# Patient Record
Sex: Female | Born: 2000
Health system: Southern US, Community
[De-identification: ages and names within clinical notes are randomized; demographics above are authoritative.]

## PROBLEM LIST (undated history)

## (undated) DIAGNOSIS — N390 Urinary tract infection, site not specified: Secondary | ICD-10-CM

## (undated) HISTORY — DX: Urinary tract infection, site not specified: N39.0

## (undated) HISTORY — PX: NO PAST SURGERIES: SHX2092

---
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
## 2018-04-03 ENCOUNTER — Emergency Department (HOSPITAL_BASED_OUTPATIENT_CLINIC_OR_DEPARTMENT_OTHER)
Admission: EM | Admit: 2018-04-03 | Discharge: 2018-04-03 | Disposition: A | Discharge status: Home / Self Care | Payer: BLUE CROSS/BLUE SHIELD | Attending: Emergency Medicine | Admitting: Emergency Medicine

## 2018-04-03 ENCOUNTER — Encounter (HOSPITAL_BASED_OUTPATIENT_CLINIC_OR_DEPARTMENT_OTHER): Payer: Self-pay | Admitting: Emergency Medicine

## 2018-04-03 ENCOUNTER — Emergency Department (HOSPITAL_BASED_OUTPATIENT_CLINIC_OR_DEPARTMENT_OTHER): Payer: BLUE CROSS/BLUE SHIELD

## 2018-04-03 ENCOUNTER — Other Ambulatory Visit: Payer: Self-pay

## 2018-04-03 DIAGNOSIS — M79604 Pain in right leg: Secondary | ICD-10-CM | POA: Diagnosis not present

## 2018-04-03 DIAGNOSIS — S99911A Unspecified injury of right ankle, initial encounter: Secondary | ICD-10-CM | POA: Diagnosis not present

## 2018-04-03 DIAGNOSIS — S8991XA Unspecified injury of right lower leg, initial encounter: Secondary | ICD-10-CM | POA: Diagnosis not present

## 2018-04-03 DIAGNOSIS — M79661 Pain in right lower leg: Secondary | ICD-10-CM | POA: Diagnosis not present

## 2018-04-03 DIAGNOSIS — S060X0A Concussion without loss of consciousness, initial encounter: Secondary | ICD-10-CM | POA: Insufficient documentation

## 2018-04-03 DIAGNOSIS — Y9389 Activity, other specified: Secondary | ICD-10-CM | POA: Insufficient documentation

## 2018-04-03 DIAGNOSIS — Y92410 Unspecified street and highway as the place of occurrence of the external cause: Secondary | ICD-10-CM | POA: Insufficient documentation

## 2018-04-03 DIAGNOSIS — S0990XA Unspecified injury of head, initial encounter: Secondary | ICD-10-CM | POA: Diagnosis present

## 2018-04-03 DIAGNOSIS — Y999 Unspecified external cause status: Secondary | ICD-10-CM | POA: Diagnosis not present

## 2018-04-03 DIAGNOSIS — R52 Pain, unspecified: Secondary | ICD-10-CM | POA: Diagnosis not present

## 2018-04-03 DIAGNOSIS — R609 Edema, unspecified: Secondary | ICD-10-CM | POA: Diagnosis not present

## 2018-04-03 DIAGNOSIS — S79911A Unspecified injury of right hip, initial encounter: Secondary | ICD-10-CM | POA: Diagnosis not present

## 2018-04-03 LAB — PREGNANCY, URINE: Preg Test, Ur: NEGATIVE

## 2018-04-03 MED ORDER — OXYCODONE-ACETAMINOPHEN 5-325 MG PO TABS: 1.0000 | ORAL_TABLET | Freq: Three times a day (TID) | ORAL | 0 refills | Status: DC | PRN

## 2018-04-03 MED ORDER — METHOCARBAMOL 500 MG PO TABS: 500.0000 mg | ORAL_TABLET | Freq: Two times a day (BID) | ORAL | 0 refills | Status: DC

## 2018-04-03 MED ORDER — OXYCODONE-ACETAMINOPHEN 5-325 MG PO TABS
1.0000 | ORAL_TABLET | Freq: Once | ORAL | Status: AC
  Administered 2018-04-03: 1 via ORAL
  Filled 2018-04-03: qty 1

## 2018-04-03 MED ORDER — IBUPROFEN 400 MG PO TABS
600.0000 mg | ORAL_TABLET | Freq: Once | ORAL | Status: AC
  Administered 2018-04-03: 600 mg via ORAL
  Filled 2018-04-03: qty 1
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
## 2018-04-03 NOTE — ED Notes (Signed)
Pt verbalizes understanding of d/c instructions and denies any further needs at this time. 
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
## 2018-04-03 NOTE — ED Notes (Signed)
Patient transported to X-ray 

## 2018-04-03 NOTE — ED Notes (Signed)
Pt returned from xray
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
## 2018-04-07 ENCOUNTER — Telehealth: Payer: Self-pay
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
## 2018-04-08 ENCOUNTER — Encounter: Payer: Self-pay | Admitting: Family Medicine

## 2018-04-08 ENCOUNTER — Ambulatory Visit: Payer: BLUE CROSS/BLUE SHIELD | Admitting: Family Medicine

## 2018-04-08 DIAGNOSIS — S060X0A Concussion without loss of consciousness, initial encounter: Secondary | ICD-10-CM

## 2018-04-08 DIAGNOSIS — S060XAA Concussion with loss of consciousness status unknown, initial encounter: Secondary | ICD-10-CM | POA: Insufficient documentation

## 2018-04-08 DIAGNOSIS — S060X9A Concussion with loss of consciousness of unspecified duration, initial encounter: Secondary | ICD-10-CM | POA: Insufficient documentation

## 2018-04-08 NOTE — Patient Instructions (Signed)
Good to see you  Fish oil 2 grams daily  Vitamin D 2000 IU daily  CoQ10 200mg  daily for headaches Stop the musce relaxer See me again on Monday

## 2018-04-08 NOTE — Assessment & Plan Note (Signed)
Mild concussion.  Discussed icing regimen and home exercises.  Discussed at this point though I do feel that patient is somewhat over work with full-time school as well as full-time work and with patient's mild decrease in cognitive abilities I would like patient to refrain from either of these until 6 days and patient will be further evaluated.  Discussed over-the-counter medications that can be beneficial, discussed different treatment options.  Discussed avoiding certain activities that can be highly metabolic for patient's brain.  Follow-up again 4 to 8 weeks

## 2018-04-13 NOTE — Progress Notes (Deleted)
Subjective:   @VITALSMCOMMENTS @  Chief Complaint: Amanda Sanders, DOB: 08/18/2000, is a 17 y.o. female who presents for No chief complaint on file.   Injury date : *** Visit #: ***  Previous imagine.   History of Present Illness:    Concussion Self-Reported Symptom Score Symptoms rated on a scale 1-6, in last 24 hours  Headache: ***    Nausea: ***  Vomiting: ***  Balance Difficulty: ***   Dizziness: ***  Fatigue: ***  Trouble Falling Asleep: ***   Sleep More Than Usual: ***  Sleep Less Than Usual: ***  Daytime Drowsiness: ***  Photophobia: ***  Phonophobia: ***  Feeling anxious: ***  Confused: ***  Irritability: ***  Sadness: ***  Nervousness: ***  Feeling More Emotional: ***  Numbness or Tingling: ***  Feeling Slowed Down: ***  Feeling Mentally Foggy: ***  Difficulty Concentrating: ***  Difficulty Remembering: ***  Visual Problems: ***  Neck Pain: ***  Tinnitus: ***   Total Symptom Score: *** Previous Symptom Score: ***  Review of Systems: Pertinent items are noted in HPI.  Review of History: Past Medical History: @PMHP @  Past Surgical History:  has no past surgical history on file. Family History: family history is not on file. no family history of autoimmune Social History:  reports that she has never smoked. She has never used smokeless tobacco. She reports that she does not drink alcohol or use drugs. Current Medications: has a current medication list which includes the following prescription(s): lidocaine, methocarbamol, and oxycodone-acetaminophen. Allergies: has No Known Allergies.  Objective:    Physical Examination There were no vitals filed for this visit. General: No apparent distress alert and oriented x3 mood and affect normal, dressed appropriately.  HEENT: Pupils equal, extraocular movements intact  Respiratory: Patient's speak in full sentences and does not appear short of breath  Cardiovascular: No lower extremity edema, non  tender, no erythema  Skin: Warm dry intact with no signs of infection or rash on extremities or on axial skeleton.  Abdomen: Soft nontender  Neuro: Cranial nerves II through XII are intact, neurovascularly intact in all extremities with 2+ DTRs and 2+ pulses.  Lymph: No lymphadenopathy of posterior or anterior cervical chain or axillae bilaterally.  Gait normal with good balance and coordination.  MSK:  Non tender with full range of motion and good stability and symmetric strength and tone of shoulders, elbows, wrist,  knee and ankles bilaterally.  Psychiatric: Oriented X3, intact recent and remote memory, judgement and insight, normal mood and affect  Concussion testing performed today:  I spent *** minutes with patient discussing test and results including review of history and patient chart and  integration of patient data, interpretation of standardized test results and clinical data, clinical decision making, treatment planning and report,and interactive feedback to the patient with all of patients questions answered.    Neurocognitive testing (ImPACT):  Baseline:*** Post #1: ***   Verbal Memory Composite *** (***%) *** (***%)   Visual Memory Composite *** (***%) *** (***%)   Visual Motor Speed Composite *** (***%) *** (***%)   Reaction Time Composite *** (***%) *** (***%)   Cognitive Efficiency Index *** ***     Vestibular Screening:   Pre VOMS  HA Score: *** Pre VOMS  Dizziness Score: ***   Headache  Dizziness  Smooth Pursuits *** ***  H. Saccades *** ***  V. Saccades *** ***  H. VOR *** ***  V. VOR *** ***  Visual Motor Sensitivity *** ***  Accommodation Right: ***  cm Left: *** cm *** ***  Convergence: *** cm Divergence: *** cm *** ***   Balance Screen: ***  Additional testing performed today: { :28529}   Assessment:    No diagnosis found.  Amanda Sanders presents with the following concussion  subtypes. [] Cognitive [] Cervical [] Vestibular [] Ocular [] Migraine [] Anxiety/Mood   Plan:   Action/Discussion: Reviewed diagnosis, management options, expected outcomes, and the reasons for scheduled and emergent follow-up. Questions were adequately answered. Patient expressed verbal understanding and agreement with the following plan.      Participation in school/work: Patient is cleared to return to work/school and activities of daily living without restrictions.  Patient is not cleared to return to work/school until further notice.  Patient may return to work/school on ***, with the following restrictions/supports:  Extra Time:  Take mental rest breaks during the day as needed. Check for return of symptoms when participating in any activities that require a significant amount of attention or concentration.  Allow extra time to complete tasks.  Please allow *** weeks to make up missed assignments, test, quizzes.  Visual/Vestibular Accommodations in School:  Allow patient to eat lunch in quiet environment with 1-2 classmates.  Allow patient to leave class 5 minutes before end of period to avoid busy/noisy hallway.  Please provide any supplemental learning materials (power points, lecture notes, handouts, etc) in minimum size 18 font and allow/provide any auditory supplements to learning when possible (books on tape, audio tape lectures, etc) to limit visual stress in the classroom.  Patient is cleared for auditory participation only. Patient is not cleared for homework, quizzes, or tests at this time.   Testing:  May begin taking tests/quizzes on *** with no more than one test/quiz per day.   No significant classroom or standardized testing until ***.  Home/Extracurricular:  Lessen work/homework load to allow adequate cognitive rest. Work *** minutes with intervals of *** minute breaks (total *** hours).  Limit visual stimulants including: driving, watching  television/movies, reading, using cell phone, etc. - to ensure relative visual cognitive rest. NOT cleared for video or phone games. May participate *** minutes with intervals of *** minute breaks (total *** hours).    Active Treatment Strategies:  Fueling your brain is important for recovery. It is essential to stay well hydrated, aiming for half of your body weight in fluid ounces per day (100 lbs = 50 oz). We also recommend eating breakfast to start your day and focus on a well-balanced diet containing lean protein, 'good' fats, and complex carbohydrates. See your nutrition / hydration handout for more details.   Quality sleep is vital in your concussion recovery. We encourage lots of sleep for the first 24-72 hours after injury but following this period it is important to regulate your sleep cycle. We encourage 8 hours of quality sleep per night. See your sleep handout for more details and strategies to quality sleep.  I  Treating your vestibular and visual dysfunction will decrease your recovery time and improve your symptoms. Begin your home vestibular exercise program as directed on your AVS.    Begin taking DHA supplement as directed.  .   Follow-up information:  Follow up appointment at [facility] .   Patient needs to arrive 30 minutes prior to appointment to complete the following tests: { :28378}.    Patient Education:  Reviewed with patient the risks (i.e, a repeat concussion, post-concussion syndrome, second-impact syndrome) of returning to play prior to complete resolution, and thoroughly reviewed the signs and symptoms of  concussion.Reviewed need for complete resolution of all symptoms, with rest AND exertion, prior to return to play.  Reviewed red flags for urgent medical evaluation: worsening symptoms, nausea/vomiting, intractable headache, musculoskeletal changes, focal neurological deficits.  [facility]'s Concussion Care Plan, which clearly  outlines the plans stated above, was given to patient.  I was personally involved with the physical evaluation of and am in agreement with the assessment and treatment plan for this patient.  Greater than 50% of this encounter was spent in direct consultation with the patient in evaluation, counseling, and coordination of care. Duration of encounter: { :28531} minutes.  After Visit Summary printed out and provided to patient as appropriate.

## 2018-04-14 ENCOUNTER — Ambulatory Visit: Payer: BLUE CROSS/BLUE SHIELD | Admitting: Family Medicine

## 2018-04-14 DIAGNOSIS — Z0289 Encounter for other administrative examinations: Secondary | ICD-10-CM

## 2018-07-10 DIAGNOSIS — Z3041 Encounter for surveillance of contraceptive pills: Secondary | ICD-10-CM | POA: Diagnosis not present

## 2018-08-11 DIAGNOSIS — N899 Noninflammatory disorder of vagina, unspecified: Secondary | ICD-10-CM | POA: Diagnosis not present

## 2018-10-27 ENCOUNTER — Other Ambulatory Visit: Payer: Self-pay

## 2018-10-27 ENCOUNTER — Inpatient Hospital Stay (HOSPITAL_COMMUNITY)
Admission: AD | Admit: 2018-10-27 | Discharge: 2018-10-27 | Disposition: A | Discharge status: Home / Self Care | Payer: BC Managed Care – PPO | Attending: Obstetrics and Gynecology | Admitting: Obstetrics and Gynecology

## 2018-10-27 DIAGNOSIS — N912 Amenorrhea, unspecified: Secondary | ICD-10-CM | POA: Insufficient documentation

## 2018-10-27 NOTE — MAU Provider Note (Signed)
Amanda Sanders is a 18 y.o. No obstetric history on file. at Unknown who presents to MAU today for pregnancy verification. The patient denies abdominal pain or vaginal bleeding today.   BP 119/61 (BP Location: Right Arm)   Pulse 77   Temp 98.4 F (36.9 C) (Oral)   Resp 16   Wt 48.5 kg   LMP 09/21/2018   SpO2 100%   BMI 20.20 kg/m   CONSTITUTIONAL: Well-developed, well-nourished female in no acute distress.  MUSCULOSKELETAL: Normal range of motion.  CARDIOVASCULAR: Regular heart rate RESPIRATORY: Normal effort NEUROLOGICAL: Alert and oriented to person, place, and time.  SKIN: No pallor. PSYCH: Normal mood and affect. Normal behavior. Normal judgment and thought content.  No results found for this or any previous visit (from the past 24 hour(s)).  MDM Patient advised that without concerning symptoms today UPT will not be performed in MAU at this time  A: Amenorrhea  P: Discharge home Pt advised that routine pregnancy tests are offered in the [facility] Monday- Friday 8:15-4:00 Patient may return to MAU as needed or if her condition were to change or worsen   Donette Larry, CNM  10/27/2018 11:23 AM

## 2018-10-27 NOTE — MAU Note (Signed)
Just here to confirm pregnancy.  Took 3 tests at home, they were all positive.  No pain or bleeding.

## 2018-10-29 ENCOUNTER — Encounter: Payer: Self-pay | Admitting: Family Medicine

## 2018-10-29 ENCOUNTER — Other Ambulatory Visit: Payer: Self-pay

## 2018-10-29 ENCOUNTER — Ambulatory Visit (INDEPENDENT_AMBULATORY_CARE_PROVIDER_SITE_OTHER): Payer: BC Managed Care – PPO

## 2018-10-29 DIAGNOSIS — Z3201 Encounter for pregnancy test, result positive: Secondary | ICD-10-CM

## 2018-10-29 LAB — POCT PREGNANCY, URINE: Preg Test, Ur: POSITIVE — AB

## 2018-10-29 NOTE — Progress Notes (Signed)
Pt here today for pregnancy test.  Resulted positive.  Pt reports LMP 09/21/18 EDD 06/28/19 5w 3d today.  Medications reconciled.  List of medications safe to take in pregnancy give to pt.  Proof of pregnancy letter provided by front office to start prenatal care.   Addison Naegeli, RN 10/29/18

## 2018-10-29 NOTE — Progress Notes (Signed)
I have reviewed this chart and agree with the RN/CMA assessment and management.    K. Meryl Alistar Mcenery, M.D. Attending [facility] (Faculty Practice)   

## 2018-12-03 ENCOUNTER — Other Ambulatory Visit: Payer: Self-pay | Admitting: Critical Care Medicine

## 2018-12-03 DIAGNOSIS — Z20822 Contact with and (suspected) exposure to covid-19: Secondary | ICD-10-CM

## 2018-12-08 LAB — NOVEL CORONAVIRUS, NAA: SARS-CoV-2, NAA: NOT DETECTED

## 2018-12-18 ENCOUNTER — Other Ambulatory Visit (HOSPITAL_COMMUNITY): Payer: Self-pay | Admitting: Family

## 2018-12-18 DIAGNOSIS — Z3682 Encounter for antenatal screening for nuchal translucency: Secondary | ICD-10-CM

## 2018-12-18 DIAGNOSIS — Z3A13 13 weeks gestation of pregnancy: Secondary | ICD-10-CM

## 2018-12-22 ENCOUNTER — Encounter (HOSPITAL_COMMUNITY): Payer: Self-pay | Admitting: *Deleted

## 2018-12-25 ENCOUNTER — Other Ambulatory Visit: Payer: Self-pay

## 2018-12-25 ENCOUNTER — Ambulatory Visit (HOSPITAL_COMMUNITY): Payer: BC Managed Care – PPO | Admitting: *Deleted

## 2018-12-25 ENCOUNTER — Ambulatory Visit (HOSPITAL_COMMUNITY)
Admission: RE | Admit: 2018-12-25 | Discharge: 2018-12-25 | Disposition: A | Discharge status: Home / Self Care | Payer: BC Managed Care – PPO | Source: Ambulatory Visit | Attending: Obstetrics and Gynecology | Admitting: Obstetrics and Gynecology

## 2018-12-25 ENCOUNTER — Encounter (HOSPITAL_COMMUNITY): Payer: Self-pay

## 2018-12-25 ENCOUNTER — Ambulatory Visit (HOSPITAL_COMMUNITY): Payer: BC Managed Care – PPO

## 2018-12-25 VITALS — BP 114/66 | HR 70 | Temp 97.8°F | Wt 110.0 lb

## 2018-12-25 DIAGNOSIS — Z3682 Encounter for antenatal screening for nuchal translucency: Secondary | ICD-10-CM | POA: Insufficient documentation

## 2018-12-25 DIAGNOSIS — Z3A13 13 weeks gestation of pregnancy: Secondary | ICD-10-CM | POA: Diagnosis not present

## 2018-12-25 DIAGNOSIS — Z369 Encounter for antenatal screening, unspecified: Secondary | ICD-10-CM | POA: Insufficient documentation

## 2018-12-27 LAB — FIRST TRIMESTER SCREEN W/NT
CRL: 73.7 mm
DIA MoM: 0.49
DIA Value: 126.3 pg/mL
Gest Age-Collect: 13.3 weeks
Maternal Age At EDD: 20.8 yr
Nuchal Translucency MoM: 0.8
Nuchal Translucency: 1.3 mm
Number of Fetuses: 1
PAPP-A MoM: 0.87
PAPP-A Value: 1706 ng/mL
Test Results:: NEGATIVE
Weight: 110 [lb_av]
hCG MoM: 0.84
hCG Value: 83.4 IU/mL

## 2018-12-29 ENCOUNTER — Telehealth (HOSPITAL_COMMUNITY): Payer: Self-pay | Admitting: Genetic Counselor

## 2018-12-29 LAB — OB RESULTS CONSOLE RPR: RPR: NONREACTIVE

## 2018-12-29 LAB — OB RESULTS CONSOLE ABO/RH: RH Type: POSITIVE

## 2018-12-29 LAB — OB RESULTS CONSOLE RUBELLA ANTIBODY, IGM: Rubella: IMMUNE

## 2018-12-29 LAB — OB RESULTS CONSOLE HEPATITIS B SURFACE ANTIGEN: Hepatitis B Surface Ag: NEGATIVE

## 2018-12-29 LAB — OB RESULTS CONSOLE HIV ANTIBODY (ROUTINE TESTING): HIV: NONREACTIVE

## 2018-12-29 NOTE — Telephone Encounter (Signed)
I called Ms. Bresnan to discuss her negative first trimester screen results. We reviewed that the risk for her pregnancy to be affected by Down syndrome decreased from her 1 in 1031 age-related risk to <1 in 10,000, and the risk for trisomy 18 decreased from her 1 in 2019 age-related risk to <1 in 10,000 based on the results of this screen. Ms. Clay was reminded that while this screen significantly reduces the likelihood of the pregnancy being affected by trisomy 67 or trisomy 40, it cannot be considered diagnostic. Diagnostic testing via [facility] or amniocentesis is available should she be interested in pursuing this. We also reviewed that first trimester screening does not screen for open neural tube defects such as spina bifida, so her doctor should order AFP screening around 16-18 weeks to screen for this. Ms. Asfour confirmed that she had no questions about these results.  Buelah Manis, MS Genetic Counselor

## 2019-02-22 ENCOUNTER — Encounter (HOSPITAL_COMMUNITY): Payer: Self-pay | Admitting: Emergency Medicine

## 2019-02-22 ENCOUNTER — Other Ambulatory Visit: Payer: Self-pay

## 2019-02-22 ENCOUNTER — Emergency Department (HOSPITAL_COMMUNITY)
Admission: EM | Admit: 2019-02-22 | Discharge: 2019-02-22 | Disposition: A | Discharge status: Home / Self Care | Payer: Medicaid Other | Attending: Emergency Medicine | Admitting: Emergency Medicine

## 2019-02-22 ENCOUNTER — Emergency Department (HOSPITAL_COMMUNITY): Payer: Medicaid Other

## 2019-02-22 DIAGNOSIS — O9989 Other specified diseases and conditions complicating pregnancy, childbirth and the puerperium: Secondary | ICD-10-CM | POA: Insufficient documentation

## 2019-02-22 DIAGNOSIS — R072 Precordial pain: Secondary | ICD-10-CM

## 2019-02-22 DIAGNOSIS — Z79899 Other long term (current) drug therapy: Secondary | ICD-10-CM | POA: Insufficient documentation

## 2019-02-22 DIAGNOSIS — Z3492 Encounter for supervision of normal pregnancy, unspecified, second trimester: Secondary | ICD-10-CM

## 2019-02-22 DIAGNOSIS — Z3A22 22 weeks gestation of pregnancy: Secondary | ICD-10-CM | POA: Diagnosis not present

## 2019-02-22 LAB — CBC
HCT: 36.8 % (ref 36.0–46.0)
Hemoglobin: 12 g/dL (ref 12.0–15.0)
MCH: 32.8 pg (ref 26.0–34.0)
MCHC: 32.6 g/dL (ref 30.0–36.0)
MCV: 100.5 fL — ABNORMAL HIGH (ref 80.0–100.0)
Platelets: 156 10*3/uL (ref 150–400)
RBC: 3.66 MIL/uL — ABNORMAL LOW (ref 3.87–5.11)
RDW: 12.9 % (ref 11.5–15.5)
WBC: 6.5 10*3/uL (ref 4.0–10.5)
nRBC: 0 % (ref 0.0–0.2)

## 2019-02-22 LAB — URINALYSIS, ROUTINE W REFLEX MICROSCOPIC
Bacteria, UA: NONE SEEN
Bilirubin Urine: NEGATIVE
Glucose, UA: NEGATIVE mg/dL
Hgb urine dipstick: NEGATIVE
Ketones, ur: NEGATIVE mg/dL
Nitrite: NEGATIVE
Protein, ur: NEGATIVE mg/dL
Specific Gravity, Urine: 1.015 (ref 1.005–1.030)
pH: 7 (ref 5.0–8.0)

## 2019-02-22 LAB — BASIC METABOLIC PANEL
Anion gap: 6 (ref 5–15)
BUN: 6 mg/dL (ref 6–20)
CO2: 23 mmol/L (ref 22–32)
Calcium: 8.6 mg/dL — ABNORMAL LOW (ref 8.9–10.3)
Chloride: 107 mmol/L (ref 98–111)
Creatinine, Ser: 0.42 mg/dL — ABNORMAL LOW (ref 0.44–1.00)
GFR calc Af Amer: >60 mL/min (ref >60)
GFR calc non Af Amer: >60 mL/min (ref >60)
Glucose, Bld: 69 mg/dL — ABNORMAL LOW (ref 70–99)
Potassium: 3.7 mmol/L (ref 3.5–5.1)
Sodium: 136 mmol/L (ref 135–145)

## 2019-02-22 LAB — HCG, QUANTITATIVE, PREGNANCY: hCG, Beta Chain, Quant, S: 23418 m[IU]/mL — ABNORMAL HIGH (ref <5)

## 2019-02-22 LAB — TROPONIN I (HIGH SENSITIVITY)
Troponin I (High Sensitivity): <2 ng/L (ref <18)
Troponin I (High Sensitivity): <2 ng/L (ref <18)

## 2019-02-22 NOTE — ED Provider Notes (Signed)
[facility]-EMERGENCY DEPT Provider Note   CSN: [id number] Arrival date & time: 02/22/19  0747     History   Chief Complaint Chief Complaint  Patient presents with  . chest pain    HPI Amanda Sanders is a 18 y.o. female.     HPI 18 year old G1, P0 female who is about 5 months pregnant comes in a chief complaint of chest pain. She reports that over the last 2 or 3 days she has had episodes of chest pain described as left-sided, sharp pain.  Pain has been intermittent with no specific evoking or aggravating factors.  When she does have the pain it is worse with deep inspiration or cough.  She has no associated shortness of breath, diaphoresis, nausea, dizziness.  Patient has no family history of CAD, clotting disorder.  She herself has not had any history of PE, DVT but was taking birth control for about a year prior to getting pregnant.  Patient denies any abdominal pain.  She does intermittently get some periumbilical pain at nighttime but that is not new and is self resolving.  She has no vaginal discharge, bleeding.  Past Medical History:  Diagnosis Date  . Medical history non-contributory   . UTI (urinary tract infection)     Patient Active Problem List   Diagnosis Date Noted  . Mild concussion 04/08/2018    Past Surgical History:  Procedure Laterality Date  . NO PAST SURGERIES       OB History    Gravida  1   Para      Term      Preterm      AB      Living  0     SAB      TAB      Ectopic      Multiple      Live Births               Home Medications    Prior to Admission medications   Medication Sig Start Date End Date Taking? Authorizing Provider  prenatal vitamin w/FE, FA (PRENATAL 1 + 1) 27-1 MG TABS tablet Take 1 tablet by mouth daily at 12 noon.    [provider]    Family History No family history on file.  Social History Social History   Tobacco Use  . Smoking status: Never Smoker  .  Smokeless tobacco: Never Used  Substance Use Topics  . Alcohol use: Never    Frequency: Never  . Drug use: Never     Allergies   Penicillins   Review of Systems Review of Systems  Constitutional: Positive for activity change.  Respiratory: Negative for shortness of breath.   Cardiovascular: Positive for chest pain.  Gastrointestinal: Negative for nausea and vomiting.  Neurological: Negative for dizziness.  All other systems reviewed and are negative.    Physical Exam Updated Vital Signs BP 105/68 (BP Location: Right Arm)   Pulse 74   Temp 99 F (37.2 C) (Oral)   Resp 18   LMP 09/21/2018 (Exact Date)   SpO2 100%   Physical Exam Vitals signs and nursing note reviewed.  Constitutional:      Appearance: She is well-developed.  HENT:     Head: Normocephalic and atraumatic.  Neck:     Musculoskeletal: Normal range of motion and neck supple.  Cardiovascular:     Rate and Rhythm: Normal rate.  Pulmonary:     Effort: Pulmonary effort is normal.  Abdominal:  General: Bowel sounds are normal.     Tenderness: There is no abdominal tenderness.  Skin:    General: Skin is warm and dry.  Neurological:     Mental Status: She is alert and oriented to person, place, and time.      ED Treatments / Results  Labs (all labs ordered are listed, but only abnormal results are displayed) Labs Reviewed  BASIC METABOLIC PANEL - Abnormal; Notable for the following components:      Result Value   Glucose, Bld 69 (*)    Creatinine, Ser 0.42 (*)    Calcium 8.6 (*)    All other components within normal limits  CBC - Abnormal; Notable for the following components:   RBC 3.66 (*)    MCV 100.5 (*)    All other components within normal limits  HCG, QUANTITATIVE, PREGNANCY - Abnormal; Notable for the following components:   hCG, Beta Chain, Quant, S 23,418 (*)    All other components within normal limits  URINALYSIS, ROUTINE W REFLEX MICROSCOPIC - Abnormal; Notable for the  following components:   APPearance HAZY (*)    Leukocytes,Ua MODERATE (*)    All other components within normal limits  TROPONIN I (HIGH SENSITIVITY)  TROPONIN I (HIGH SENSITIVITY)    EKG EKG Interpretation  Date/Time:  "Sunday February 22 2019 07:58:54 EDT Ventricular Rate:  80 PR Interval:    QRS Duration: 84 QT Interval:  355 QTC Calculation: 410 R Axis:   37 Text Interpretation:  Sinus rhythm RSR' in V1 or V2, probably normal variant Borderline T abnormalities, anterior leads No old tracing to compare Confirmed by Belfi, Melanie ([id number]) on 02/22/2019 8:01:32 AM   Radiology Dg Chest 2 View  Result Date: 02/22/2019 CLINICAL DATA:  Chest pain. EXAM: CHEST - 2 VIEW COMPARISON:  None. FINDINGS: The heart size and mediastinal contours are within normal limits. Both lungs are clear. No pneumothorax or pleural effusion is noted. The visualized skeletal structures are unremarkable. IMPRESSION: No active cardiopulmonary disease. Electronically Signed   By: James  Green Jr M.D.   On: 02/22/2019 09:35    Procedures Procedures (including critical care time)  Medications Ordered in ED Medications - No data to display   Initial Impression / Assessment and Plan / ED Course  I have reviewed the triage vital signs and the nursing notes.  Pertinent labs & imaging results that were available during my care of the patient were reviewed by me and considered in my medical decision making (see chart for details).        18"  year old comes in a chief complaint of chest pain.  She is G1, P0 about 5 months pregnant.  She was on birth control prior to getting pregnant.  She has no known history of PE, DVT.  On exam she is not tachycardic, tachypneic, hypoxic.  She denies any shortness of breath and the pain is not reproducible with deep inspiration or with coughing in the ED.  She does indicate that when she is hurting, the pain is worse with deep inspiration.  Suspicion for PE is present but it is  low.  We had a shared decision-making discussion with the patient.  She was made aware that she is at high risk of having PE because of her pregnancy and prior birth control use, however our pretest probability for the PE is low.  We discussed the conservative versus aggressive approach of ruling out PE in the ED and she prefers a conservative approach of waiting and  watching.  She will return to the ER if her symptoms get worse.  We have discussed strict ER return precautions with her.  In the interim, her EKG is completely normal and her delta troponin is also negative.  She is stable for discharge.  Final Clinical Impressions(s) / ED Diagnoses   Final diagnoses:  Precordial chest pain  Pregnant and not yet delivered in second trimester    ED Discharge Orders         Ordered    LE VENOUS     02/22/19 Pickens, Kindsey Eblin, MD 02/22/19 Vernelle Emerald

## 2019-02-22 NOTE — Discharge Instructions (Signed)
We saw in the ER for chest pain. It is unclear why you are having this off and on chest pain episodes.  As discussed, our suspicion for blood clot is low but it is not entirely eliminated.  You have agreed to a conservative approach, therefore we request you to return to the ER if you start having worsening of the chest pain, worsening of shortness of breath, episodes of near fainting or fainting.  If you start having chest pain with inspiration or bloody cough, also return to the ER immediately.  Ultrasound of your legs have been ordered as an outpatient.

## 2019-02-22 NOTE — ED Triage Notes (Signed)
Pt c/o intermittent chest pains and upper abd pains. Pt is [redacted] weeks pregnant. Denies n/v/d or urinary problems.

## 2019-02-23 ENCOUNTER — Ambulatory Visit (HOSPITAL_COMMUNITY): Admission: RE | Admit: 2019-02-23 | Payer: Medicaid Other | Source: Ambulatory Visit

## 2019-03-23 ENCOUNTER — Inpatient Hospital Stay (HOSPITAL_COMMUNITY)
Admission: AD | Admit: 2019-03-23 | Discharge: 2019-03-23 | Disposition: A | Discharge status: Home / Self Care | Payer: Medicaid Other | Attending: Obstetrics and Gynecology | Admitting: Obstetrics and Gynecology

## 2019-03-23 ENCOUNTER — Other Ambulatory Visit: Payer: Self-pay

## 2019-03-23 ENCOUNTER — Encounter (HOSPITAL_COMMUNITY): Payer: Self-pay

## 2019-03-23 DIAGNOSIS — Z880 Allergy status to penicillin: Secondary | ICD-10-CM | POA: Diagnosis not present

## 2019-03-23 DIAGNOSIS — W19XXXA Unspecified fall, initial encounter: Secondary | ICD-10-CM | POA: Diagnosis not present

## 2019-03-23 DIAGNOSIS — Z3A26 26 weeks gestation of pregnancy: Secondary | ICD-10-CM

## 2019-03-23 DIAGNOSIS — O26892 Other specified pregnancy related conditions, second trimester: Secondary | ICD-10-CM | POA: Diagnosis not present

## 2019-03-23 DIAGNOSIS — R109 Unspecified abdominal pain: Secondary | ICD-10-CM | POA: Diagnosis not present

## 2019-03-23 DIAGNOSIS — R55 Syncope and collapse: Secondary | ICD-10-CM | POA: Insufficient documentation

## 2019-03-23 DIAGNOSIS — O9A212 Injury, poisoning and certain other consequences of external causes complicating pregnancy, second trimester: Secondary | ICD-10-CM | POA: Insufficient documentation

## 2019-03-23 LAB — COMPREHENSIVE METABOLIC PANEL
ALT: 27 U/L (ref 0–44)
AST: 26 U/L (ref 15–41)
Albumin: 3 g/dL — ABNORMAL LOW (ref 3.5–5.0)
Alkaline Phosphatase: 42 U/L (ref 38–126)
Anion gap: 12 (ref 5–15)
BUN: 5 mg/dL — ABNORMAL LOW (ref 6–20)
CO2: 19 mmol/L — ABNORMAL LOW (ref 22–32)
Calcium: 8.5 mg/dL — ABNORMAL LOW (ref 8.9–10.3)
Chloride: 106 mmol/L (ref 98–111)
Creatinine, Ser: 0.5 mg/dL (ref 0.44–1.00)
GFR calc Af Amer: >60 mL/min (ref >60)
GFR calc non Af Amer: >60 mL/min (ref >60)
Glucose, Bld: 72 mg/dL (ref 70–99)
Potassium: 3.4 mmol/L — ABNORMAL LOW (ref 3.5–5.1)
Sodium: 137 mmol/L (ref 135–145)
Total Bilirubin: 0.3 mg/dL (ref 0.3–1.2)
Total Protein: 6.2 g/dL — ABNORMAL LOW (ref 6.5–8.1)

## 2019-03-23 LAB — CBC
HCT: 35.3 % — ABNORMAL LOW (ref 36.0–46.0)
Hemoglobin: 12.3 g/dL (ref 12.0–15.0)
MCH: 33.2 pg (ref 26.0–34.0)
MCHC: 34.8 g/dL (ref 30.0–36.0)
MCV: 95.1 fL (ref 80.0–100.0)
Platelets: 142 10*3/uL — ABNORMAL LOW (ref 150–400)
RBC: 3.71 MIL/uL — ABNORMAL LOW (ref 3.87–5.11)
RDW: 12.3 % (ref 11.5–15.5)
WBC: 6.5 10*3/uL (ref 4.0–10.5)
nRBC: 0 % (ref 0.0–0.2)

## 2019-03-23 LAB — URINALYSIS, ROUTINE W REFLEX MICROSCOPIC
Bilirubin Urine: NEGATIVE
Glucose, UA: NEGATIVE mg/dL
Hgb urine dipstick: NEGATIVE
Ketones, ur: 15 mg/dL — AB
Nitrite: NEGATIVE
Protein, ur: NEGATIVE mg/dL
Specific Gravity, Urine: 1.01 (ref 1.005–1.030)
pH: 7.5 (ref 5.0–8.0)

## 2019-03-23 LAB — GLUCOSE, CAPILLARY: Glucose-Capillary: 63 mg/dL — ABNORMAL LOW (ref 70–99)

## 2019-03-23 LAB — URINALYSIS, MICROSCOPIC (REFLEX)

## 2019-03-23 NOTE — MAU Note (Signed)
Pt is G1P0 who arrived via EMS. She says she got up from bed around 1100 this morning to brush her teeth. She says she started feeling weak but doesn't remember anything after that. Her boyfriend found her on the floor and had to shake her to wake her up. He said she was on her stomach at the time. Patient denies any bleeding or LOF. +FM. Rating lower abdominal pain 5/10.  Patient has no hx of syncope. She last ate 6pm last night. Had some water during the night.

## 2019-03-23 NOTE — Discharge Instructions (Signed)
Preventing Injuries During Pregnancy °Trauma is the most common cause of injury and death in pregnant women. This can also result in serious harm to the baby or even death. °How can injuries affect my pregnancy? °Your baby is protected in the womb (uterus) by a sac filled with fluid (amniotic sac). Your baby can be harmed if there is a direct blow to your abdomen and pelvis. Trauma may be caused by: °· Falls. These are more common in the second and third trimester of pregnancy. °· Automobile accidents. °· Domestic violence or assault. °· Severe burns, such as from fire or electricity. °These injuries can result in: °· Tearing of your uterus. °· The placenta pulling away from the wall of the uterus (placental abruption). °· The amniotic sac breaking open (rupture of membranes). °· Blockage or decrease in the blood supply to your baby. °· Going into labor earlier than expected. °· Severe injuries to other parts of your body, such as your brain, spine, heart, lungs, or other organs. °Minor falls and low-impact automobile accidents do not usually harm your baby, even if they cause a little harm to you. °What can I do to lower my risk? °Safety °· Remove slippery rugs and loose objects on the floor. They increase your risk of tripping or slipping. °· Wear comfortable shoes that have a good grip on the sole. Do not wear high-heeled shoes. °· Always wear your seat belt properly when riding in a car. Use both the lap and shoulder belt, with the lap belt below your abdomen. Always practice safe driving. Do not ride on a motorcycle while pregnant. °Activity °· Avoid walking on wet or slippery floors. °· Do not participate in rough and violent activities or sports. °· Avoid high-risk situations and activities such as: °? Lifting heavy pots of boiling or hot liquids. °? Fixing electrical problems. °? Being near fires or starting fires. °General instructions °· Take over-the-counter and prescription medicines only as told by your  health care provider. °· Know your blood type and the father's blood type in case you develop vaginal bleeding or experience an injury for which a blood transfusion is needed. °· Spousal abuse can be a serious cause of trauma during pregnancy. If you are a victim of domestic violence or assault: °? Call your local emergency services (911 in the [place] °? Contact the National Domestic Violence Hotline for help and support. °When should I seek immediate medical care? °Get help right away if: °· You fall on your abdomen or experience any serious blow to your abdomen. °· You develop stiffness in your neck or pain after a fall or from other trauma. °· You develop a headache or vision problems after a fall or from other trauma. °· You do not feel the baby moving after a fall or trauma, or you feel that the baby is not moving as much as before the fall or trauma. °· You have been the victim of domestic violence or any other kind of physical attack. °· You have been in a car accident. °· You develop vaginal bleeding. °· You have fluid leaking from the vagina. °· You develop uterine contractions. Symptoms include pelvic cramping, pain, or serious low back pain. °· You become weak, faint, or have uncontrolled vomiting after trauma. °· You have a serious burn. This includes burns to the face, neck, hands, or genitals, or burns greater than the size of your palm anywhere else. °Summary °· Trauma is the most common cause of injury and death   in pregnant women and can also lead to injury or death of the baby.  Falls, automobile accidents, domestic violence or assault, and severe burns can injure you or your baby. Make sure to get medical help right away if you experience any of these during your pregnancy.  Take steps to prevent slips or falls in your home, such as avoiding slippery floors and removing loose rugs.  Always wear your seat belt properly when riding in a car. Practice safe driving. This information is not  intended to replace advice given to you by your health care provider. Make sure you discuss any questions you have with your health care provider. Document Released: 06/21/2004 Document Revised: 09/11/2018 Document Reviewed: 05/23/2016 Elsevier Patient Education  2020 [employer].      Eating Plan for Pregnant Women While you are pregnant, your body requires additional nutrition to help support your growing baby. You also have a higher need for some vitamins and minerals, such as folic acid, calcium, iron, and vitamin D. Eating a healthy, well-balanced diet is very important for your health and your baby's health. Your need for extra calories varies for the three 41-month segments of your pregnancy (trimesters). For most women, it is recommended to consume:  150 extra calories a day during the first trimester.  300 extra calories a day during the second trimester.  300 extra calories a day during the third trimester. What are tips for following this plan?   Do not try to lose weight or go on a diet during pregnancy.  Limit your overall intake of foods that have "empty calories." These are foods that have little nutritional value, such as sweets, desserts, candies, and sugar-sweetened beverages.  Eat a variety of foods (especially fruits and vegetables) to get a full range of vitamins and minerals.  Take a prenatal vitamin to help meet your additional vitamin and mineral needs during pregnancy, specifically for folic acid, iron, calcium, and vitamin D.  Remember to stay active. Ask your health care provider what types of exercise and activities are safe for you.  Practice good food safety and cleanliness. Wash your hands before you eat and after you prepare raw meat. Wash all fruits and vegetables well before peeling or eating. Taking these actions can help to prevent food-borne illnesses that can be very dangerous to your baby, such as listeriosis. Ask your health care provider for more  information about listeriosis. What does 150 extra calories look like? Healthy options that provide 150 extra calories each day could be any of the following:  6-8 oz (170-230 g) of plain low-fat yogurt with  cup of berries.  1 apple with 2 teaspoons (11 g) of peanut butter.  Cut-up vegetables with  cup (60 g) of hummus.  8 oz (230 mL) or 1 cup of low-fat chocolate milk.  1 stick of string cheese with 1 medium orange.  1 peanut butter and jelly sandwich that is made with one slice of whole-wheat bread and 1 tsp (5 g) of peanut butter. For 300 extra calories, you could eat two of those healthy options each day. What is a healthy amount of weight to gain? The right amount of weight gain for you is based on your BMI before you became pregnant. If your BMI:  Was less than 18 (underweight), you should gain 28-40 lb (13-18 kg).  Was 18-24.9 (normal), you should gain 25-35 lb (11-16 kg).  Was 25-29.9 (overweight), you should gain 15-25 lb (7-11 kg).  Was 30 or  greater (obese), you should gain 11-20 lb (5-9 kg). What if I am having twins or multiples? Generally, if you are carrying twins or multiples:  You may need to eat 300-600 extra calories a day.  The recommended range for total weight gain is 25-54 lb (11-25 kg), depending on your BMI before pregnancy.  Talk with your health care provider to find out about nutritional needs, weight gain, and exercise that is right for you. What foods can I eat?  Grains All grains. Choose whole grains, such as whole-wheat bread, oatmeal, or brown rice. Vegetables All vegetables. Eat a variety of colors and types of vegetables. Remember to wash your vegetables well before peeling or eating. Fruits All fruits. Eat a variety of colors and types of fruit. Remember to wash your fruits well before peeling or eating. Meats and other protein foods Lean meats, including chicken, [place]urkey, fish, and lean cuts of beef, veal, or pork. If you eat fish or  seafood, choose options that are higher in omega-3 fatty acids and lower in mercury, such as salmon, herring, mussels, trout, sardines, pollock, shrimp, crab, and lobster. Tofu. Tempeh. Beans. Eggs. Peanut butter and other nut butters. Make sure that all meats, poultry, and eggs are cooked to food-safe temperatures or "well-done." Two or more servings of fish are recommended each week in order to get the most benefits from omega-3 fatty acids that are found in seafood. Choose fish that are lower in mercury. You can find more information online:  PumpkinSearch.com.eewww.fda.gov Dairy Pasteurized milk and milk alternatives (such as almond milk). Pasteurized yogurt and pasteurized cheese. Cottage cheese. Sour cream. Beverages Water. Juices that contain 100% fruit juice or vegetable juice. Caffeine-free teas and decaffeinated coffee. Drinks that contain caffeine are okay to drink, but it is better to avoid caffeine. Keep your total caffeine intake to less than 200 mg each day (which is 12 oz or 355 mL of coffee, tea, or soda) or the limit as told by your health care provider. Fats and oils Fats and oils are okay to include in moderation. Sweets and desserts Sweets and desserts are okay to include in moderation. Seasoning and other foods All pasteurized condiments. The items listed above may not be a complete list of recommended foods and beverages. Contact your dietitian for more options. The items listed above may not be a complete list of foods and beverages [you/your child] can eat. Contact a dietitian for more information. What foods are not recommended? Vegetables Raw (unpasteurized) vegetable juices. Fruits Unpasteurized fruit juices. Meats and other protein foods Lunch meats, bologna, hot dogs, or other deli meats. (If you must eat those meats, reheat them until they are steaming hot.) Refrigerated pat, meat spreads from a meat counter, smoked seafood that is found in the refrigerated section of a store. Raw  or undercooked meats, poultry, and eggs. Raw fish, such as sushi or sashimi. Fish that have high mercury content, such as tilefish, shark, swordfish, and king mackerel. To learn more about mercury in fish, talk with your health care provider or look for online resources, such as:  PumpkinSearch.com.eewww.fda.gov Dairy Raw (unpasteurized) milk and any foods that have raw milk in them. Soft cheeses, such as feta, queso blanco, queso fresco, Brie, Camembert cheeses, blue-veined cheeses, and Panela cheese (unless it is made with pasteurized milk, which must be stated on the label). Beverages Alcohol. Sugar-sweetened beverages, such as sodas, teas, or energy drinks. Seasoning and other foods Homemade fermented foods and drinks, such as pickles, sauerkraut, or kombucha  drinks. (Store-bought pasteurized versions of these are okay.) Salads that are made in a store or deli, such as ham salad, chicken salad, egg salad, tuna salad, and seafood salad. The items listed above may not be a complete list of foods and beverages to avoid. Contact your dietitian for more information. The items listed above may not be a complete list of foods and beverages [you/your child] should avoid. Contact a dietitian for more information. Where to find more information To calculate the number of calories you need based on your height, weight, and activity level, you can use an online calculator such as:  MobileTransition.ch To calculate how much weight you should gain during pregnancy, you can use an online pregnancy weight gain calculator such as:  StreamingFood.com.cy Summary  While you are pregnant, your body requires additional nutrition to help support your growing baby.  Eat a variety of foods, especially fruits and vegetables to get a full range of vitamins and minerals.  Practice good food safety and cleanliness. Wash your hands before you eat and after you prepare raw meat. Wash all  fruits and vegetables well before peeling or eating. Taking these actions can help to prevent food-borne illnesses, such as listeriosis, that can be very dangerous to your baby.  Do not eat raw meat or fish. Do not eat fish that have high mercury content, such as tilefish, shark, swordfish, and king mackerel. Do not eat unpasteurized (raw) dairy.  Take a prenatal vitamin to help meet your additional vitamin and mineral needs during pregnancy, specifically for folic acid, iron, calcium, and vitamin D. This information is not intended to replace advice given to you by your health care provider. Make sure you discuss any questions you have with your health care provider. Document Released: 02/26/2014 Document Revised: 09/04/2018 Document Reviewed: 02/08/2017 Elsevier Patient Education  2020 [employer].

## 2019-03-23 NOTE — MAU Provider Note (Signed)
Chief Complaint:  Fall   First Provider Initiated Contact with Patient 03/23/19 1324     HPI: Amanda Sanders is a 18 y.o. G1P0 at [redacted]w[redacted]d who presents to maternity admissions via EMS reporting syncopal episode & fall. States she woke up this morning (unsure what time), felt woozy while she was brushing her teeth then at around 11 am her boyfriend found her on her stomach. States she doesn't know how long she was out. She doesn't think she hit her head and denies head pain. Endorses some lower abdominal cramping & pressure that was present prior to the fall. Last ate & drank yesterday evening; has had nothing to eat or drink today. Has not had an episode like this before. Denies chest pain, SOB, or palpitations.  Denies contractions, leakage of fluid or vaginal bleeding. Good fetal movement.  Location: abdomen Quality: cramping Severity: 5/10 in pain scale Duration: 1 day Timing: intermittent Modifying factors: none Associated signs and symptoms: syncope  Pregnancy Course: goes to [facility]. Denies complications with current pregnancy.   Past Medical History:  Diagnosis Date  . UTI (urinary tract infection)    OB History  Gravida Para Term Preterm AB Living  1         0  SAB TAB Ectopic Multiple Live Births               # Outcome Date GA Lbr Len/2nd Weight Sex Delivery Anes PTL Lv  1 Current            Past Surgical History:  Procedure Laterality Date  . NO PAST SURGERIES     No family history on file. Social History   Tobacco Use  . Smoking status: Never Smoker  . Smokeless tobacco: Never Used  Substance Use Topics  . Alcohol use: Never    Frequency: Never  . Drug use: Never   Allergies  Allergen Reactions  . Penicillins Rash   No medications prior to admission.    I have reviewed patient's Past Medical Hx, Surgical Hx, Family Hx, Social Hx, medications and allergies.   ROS:  Review of Systems  Constitutional: Negative.   Respiratory: Negative.   Cardiovascular:  Negative.   Gastrointestinal: Positive for abdominal pain. Negative for constipation, diarrhea, nausea and vomiting.  Genitourinary: Negative.   Neurological: Positive for syncope. Negative for dizziness, facial asymmetry and headaches.    Physical Exam   Patient Vitals for the past 24 hrs:  BP Temp Temp src Pulse Resp SpO2  03/23/19 1654 99/60 - - - - -  03/23/19 1637 99/60 - - 88 - -  03/23/19 1500 - - - - - 100 %  03/23/19 1400 - - - - - 100 %  03/23/19 1330 - - - - - 100 %  03/23/19 1312 107/63 - - 89 - -  03/23/19 1310 (!) 103/58 - - 70 - -  03/23/19 1307 109/65 - - 70 - -  03/23/19 1300 - - - - - 100 %  03/23/19 1236 112/66 - - 71 - -  03/23/19 1235 112/66 98.4 F (36.9 C) Oral 68 18 100 %    Constitutional: Well-developed, well-nourished female in no acute distress.  Cardiovascular: normal rate & rhythm, no murmur Respiratory: normal effort, lung sounds clear throughout GI: Abd soft, non-tender, gravid appropriate for gestational age. Pos BS x 4 MS: Extremities nontender, no edema, normal ROM Neurologic: Alert and oriented x 4.  GU:      Pelvic: NEFG, physiologic discharge, no blood, cervix  clean.   Dilation: Closed Effacement (%): Thick Exam by:: Jorje Guild, NP  NST:  Baseline: 145 bpm, Variability: Good {> 6 bpm), Accelerations: Non-reactive but appropriate for gestational age and Decelerations: Absent   Labs: Results for orders placed or performed during the hospital encounter of 03/23/19 (from the past 24 hour(s))  Urinalysis, Routine w reflex microscopic     Status: Abnormal   Collection Time: 03/23/19 12:31 PM  Result Value Ref Range   Color, Urine YELLOW YELLOW   APPearance HAZY (A) CLEAR   Specific Gravity, Urine 1.010 1.005 - 1.030   pH 7.5 5.0 - 8.0   Glucose, UA NEGATIVE NEGATIVE mg/dL   Hgb urine dipstick NEGATIVE NEGATIVE   Bilirubin Urine NEGATIVE NEGATIVE   Ketones, ur 15 (A) NEGATIVE mg/dL   Protein, ur NEGATIVE NEGATIVE mg/dL   Nitrite  NEGATIVE NEGATIVE   Leukocytes,Ua LARGE (A) NEGATIVE  Urinalysis, Microscopic (reflex)     Status: Abnormal   Collection Time: 03/23/19 12:31 PM  Result Value Ref Range   RBC / HPF 0-5 0 - 5 RBC/hpf   WBC, UA 6-10 0 - 5 WBC/hpf   Bacteria, UA RARE (A) NONE SEEN   Squamous Epithelial / LPF 0-5 0 - 5   Mucus PRESENT   CBC     Status: Abnormal   Collection Time: 03/23/19 12:56 PM  Result Value Ref Range   WBC 6.5 4.0 - 10.5 K/uL   RBC 3.71 (L) 3.87 - 5.11 MIL/uL   Hemoglobin 12.3 12.0 - 15.0 g/dL   HCT 35.3 (L) 36.0 - 46.0 %   MCV 95.1 80.0 - 100.0 fL   MCH 33.2 26.0 - 34.0 pg   MCHC 34.8 30.0 - 36.0 g/dL   RDW 12.3 11.5 - 15.5 %   Platelets 142 (L) 150 - 400 K/uL   nRBC 0.0 0.0 - 0.2 %  Comprehensive metabolic panel     Status: Abnormal   Collection Time: 03/23/19 12:56 PM  Result Value Ref Range   Sodium 137 135 - 145 mmol/L   Potassium 3.4 (L) 3.5 - 5.1 mmol/L   Chloride 106 98 - 111 mmol/L   CO2 19 (L) 22 - 32 mmol/L   Glucose, Bld 72 70 - 99 mg/dL   BUN 5 (L) 6 - 20 mg/dL   Creatinine, Ser 0.50 0.44 - 1.00 mg/dL   Calcium 8.5 (L) 8.9 - 10.3 mg/dL   Total Protein 6.2 (L) 6.5 - 8.1 g/dL   Albumin 3.0 (L) 3.5 - 5.0 g/dL   AST 26 15 - 41 U/L   ALT 27 0 - 44 U/L   Alkaline Phosphatase 42 38 - 126 U/L   Total Bilirubin 0.3 0.3 - 1.2 mg/dL   GFR calc non Af Amer >60 >60 mL/min   GFR calc Af Amer >60 >60 mL/min   Anion gap 12 5 - 15  Glucose, capillary     Status: Abnormal   Collection Time: 03/23/19  1:05 PM  Result Value Ref Range   Glucose-Capillary 63 (L) 70 - 99 mg/dL    Imaging:  No results found.  MAU Course: Orders Placed This Encounter  Procedures  . Urinalysis, Routine w reflex microscopic  . CBC  . Comprehensive metabolic panel  . Glucose, capillary  . Urinalysis, Microscopic (reflex)  . Orthostatic vital signs  . ED EKG  . Discharge patient   No orders of the defined types were placed in this encounter.   MDM: VSS. Not orthostatic Abdomen  soft &  non tender. Cervix closed/thick. No Ctx on monitor. 4 hrs of monitoring, category 1 tracing.  CBG 63 & pt hasn't eaten since last night. Discussed likely cause of symptoms this morning.  EKG normal (reviewed by Dr. Earlene PlaterWallace) & rest of labs pregnancy normal.   Is transferring care & has first ob appt with [facility] on Friday.  Assessment: 1. Syncope, unspecified syncope type   2. Traumatic injury during pregnancy in second trimester   3. [redacted] weeks gestation of pregnancy     Plan: Discharge home in stable condition.  Preterm Labor precautions and fetal kick counts Keep new ob appt with [facility] Increase water intake during the day & eat every 2-3 hours  Follow-up Information    [facility].   Specialty: Obstetrics and Gynecology Why: keep initial appointment on Friday Contact information: [address] [place] [id number] [phone]       [facility] Follow up.   Specialty: Obstetrics and Gynecology Why: return for worsening symptoms Contact information: [address] [id number] [place] [id number] [phone]          Allergies as of 03/23/2019      Reactions   Penicillins Rash      Medication List    TAKE these medications   prenatal vitamin w/FE, FA 27-1 MG Tabs tablet Take 1 tablet by mouth daily at 12 noon.       Judeth HornLawrence, Lynnea Vandervoort, NP 03/23/2019 5:38 PM

## 2019-05-29 NOTE — L&D Delivery Note (Signed)
Delivery Note Labor onset: 06/18/2019  Labor Onset Time: 0200 Complete dilation at 2:45 PM  Onset of pushing at 1515 FHR second stage Cat 1 Analgesia/Anesthesia intrapartum: Epidural  Guided pushing with maternal urge. Delivery of a viable female at 1534. Fetal head delivered in LOA position. Nuchal cord x1 with body cord delivered via somersault maneuver to maternal right. Infant placed on maternal abd, dried, and tactile stim. Lusty cry noted before 1 minute. Cord double clamped and cut after pulsation ended by Richie, father.  Cord blood sample collected Arterial cord blood sample n/a.  Placenta delivered Schultz side, spontaneous, intact, with 3 VC.  Placenta to L&D. Uterine tone firm bleeding scant  No laceration identified.  Anesthesia: n/a Repair: n/a QBL (mL): 11 ml Complications: none APGAR: APGAR (1 MIN): 9   APGAR (5 MINS): 9   APGAR (10 MINS):   Mom to OB Specialty.  Baby to Couplet care / Skin to Skin   Maternal protein creatinine ratio 2.44. New dx pre-eclampsia, negative for neuro S/S. Will start PP MgSO4. Plan discussed w. Dr. Rivard.   Haydon Dorris B Linette Gunderson MSN, CNM 06/18/2019, 4:10 PM   

## 2019-06-05 LAB — OB RESULTS CONSOLE GBS: GBS: POSITIVE

## 2019-06-08 LAB — OB RESULTS CONSOLE GC/CHLAMYDIA: Gonorrhea: NEGATIVE

## 2019-06-18 ENCOUNTER — Inpatient Hospital Stay (HOSPITAL_COMMUNITY): Payer: Medicaid Other | Admitting: Anesthesiology

## 2019-06-18 ENCOUNTER — Encounter (HOSPITAL_COMMUNITY): Payer: Self-pay | Admitting: Obstetrics & Gynecology

## 2019-06-18 ENCOUNTER — Inpatient Hospital Stay (HOSPITAL_COMMUNITY)
Admission: AD | Admit: 2019-06-18 | Discharge: 2019-06-21 | DRG: 806 | Disposition: A | Discharge status: Home / Self Care | Payer: Medicaid Other | Attending: Obstetrics and Gynecology | Admitting: Obstetrics and Gynecology

## 2019-06-18 ENCOUNTER — Other Ambulatory Visit: Payer: Self-pay

## 2019-06-18 DIAGNOSIS — O26893 Other specified pregnancy related conditions, third trimester: Secondary | ICD-10-CM | POA: Diagnosis present

## 2019-06-18 DIAGNOSIS — O9A22 Injury, poisoning and certain other consequences of external causes complicating childbirth: Secondary | ICD-10-CM | POA: Diagnosis not present

## 2019-06-18 DIAGNOSIS — O1415 Severe pre-eclampsia, complicating the puerperium: Secondary | ICD-10-CM | POA: Diagnosis not present

## 2019-06-18 DIAGNOSIS — T368X5A Adverse effect of other systemic antibiotics, initial encounter: Secondary | ICD-10-CM | POA: Diagnosis not present

## 2019-06-18 DIAGNOSIS — O1490 Unspecified pre-eclampsia, unspecified trimester: Secondary | ICD-10-CM | POA: Diagnosis not present

## 2019-06-18 DIAGNOSIS — O9912 Other diseases of the blood and blood-forming organs and certain disorders involving the immune mechanism complicating childbirth: Secondary | ICD-10-CM | POA: Diagnosis present

## 2019-06-18 DIAGNOSIS — Z20822 Contact with and (suspected) exposure to covid-19: Secondary | ICD-10-CM | POA: Diagnosis present

## 2019-06-18 DIAGNOSIS — O134 Gestational [pregnancy-induced] hypertension without significant proteinuria, complicating childbirth: Principal | ICD-10-CM | POA: Diagnosis present

## 2019-06-18 DIAGNOSIS — D696 Thrombocytopenia, unspecified: Secondary | ICD-10-CM | POA: Diagnosis present

## 2019-06-18 DIAGNOSIS — O99824 Streptococcus B carrier state complicating childbirth: Secondary | ICD-10-CM | POA: Diagnosis present

## 2019-06-18 DIAGNOSIS — Z3A38 38 weeks gestation of pregnancy: Secondary | ICD-10-CM

## 2019-06-18 DIAGNOSIS — Z880 Allergy status to penicillin: Secondary | ICD-10-CM

## 2019-06-18 DIAGNOSIS — B951 Streptococcus, group B, as the cause of diseases classified elsewhere: Secondary | ICD-10-CM | POA: Diagnosis present

## 2019-06-18 LAB — CBC
HCT: 39.3 % (ref 36.0–46.0)
HCT: 39.6 % (ref 36.0–46.0)
Hemoglobin: 13.4 g/dL (ref 12.0–15.0)
Hemoglobin: 13.4 g/dL (ref 12.0–15.0)
MCH: 31.7 pg (ref 26.0–34.0)
MCH: 31.9 pg (ref 26.0–34.0)
MCHC: 34.1 g/dL (ref 30.0–36.0)
MCHC: 33.8 g/dL (ref 30.0–36.0)
MCV: 92.9 fL (ref 80.0–100.0)
MCV: 94.3 fL (ref 80.0–100.0)
Platelets: 118 10*3/uL — ABNORMAL LOW (ref 150–400)
Platelets: 96 10*3/uL — ABNORMAL LOW (ref 150–400)
RBC: 4.23 MIL/uL (ref 3.87–5.11)
RBC: 4.2 MIL/uL (ref 3.87–5.11)
RDW: 13.8 % (ref 11.5–15.5)
RDW: 14.1 % (ref 11.5–15.5)
WBC: 7.9 10*3/uL (ref 4.0–10.5)
WBC: 9.1 10*3/uL (ref 4.0–10.5)
nRBC: 0 % (ref 0.0–0.2)
nRBC: 0 % (ref 0.0–0.2)

## 2019-06-18 LAB — COMPREHENSIVE METABOLIC PANEL
ALT: 13 U/L (ref 0–44)
AST: 31 U/L (ref 15–41)
Albumin: 2.9 g/dL — ABNORMAL LOW (ref 3.5–5.0)
Alkaline Phosphatase: 111 U/L (ref 38–126)
Anion gap: 10 (ref 5–15)
BUN: 8 mg/dL (ref 6–20)
CO2: 19 mmol/L — ABNORMAL LOW (ref 22–32)
Calcium: 8.9 mg/dL (ref 8.9–10.3)
Chloride: 109 mmol/L (ref 98–111)
Creatinine, Ser: 0.71 mg/dL (ref 0.44–1.00)
GFR calc Af Amer: >60 mL/min (ref >60)
GFR calc non Af Amer: >60 mL/min (ref >60)
Glucose, Bld: 70 mg/dL (ref 70–99)
Potassium: 4.5 mmol/L (ref 3.5–5.1)
Sodium: 138 mmol/L (ref 135–145)
Total Bilirubin: 0.6 mg/dL (ref 0.3–1.2)
Total Protein: 6.1 g/dL — ABNORMAL LOW (ref 6.5–8.1)

## 2019-06-18 LAB — TYPE AND SCREEN
ABO/RH(D): O POS
Antibody Screen: NEGATIVE

## 2019-06-18 LAB — PROTEIN / CREATININE RATIO, URINE
Creatinine, Urine: 33.17 mg/dL
Protein Creatinine Ratio: 2.44 mg/mg{Cre} — ABNORMAL HIGH (ref 0.00–0.15)
Total Protein, Urine: 81 mg/dL

## 2019-06-18 LAB — RESPIRATORY PANEL BY RT PCR (FLU A&B, COVID)
Influenza A by PCR: NEGATIVE
Influenza B by PCR: NEGATIVE
SARS Coronavirus 2 by RT PCR: NEGATIVE

## 2019-06-18 LAB — OB RESULTS CONSOLE GC/CHLAMYDIA: Chlamydia: NEGATIVE

## 2019-06-18 LAB — RPR: RPR Ser Ql: NONREACTIVE

## 2019-06-18 LAB — ABO/RH: ABO/RH(D): O POS

## 2019-06-18 MED ORDER — FLEET ENEMA 7-19 GM/118ML RE ENEM: 1.0000 | ENEMA | RECTAL | Status: DC | PRN

## 2019-06-18 MED ORDER — ACETAMINOPHEN 325 MG PO TABS: 650.0000 mg | ORAL_TABLET | ORAL | Status: DC | PRN

## 2019-06-18 MED ORDER — PHENYLEPHRINE 40 MCG/ML (10ML) SYRINGE FOR IV PUSH (FOR BLOOD PRESSURE SUPPORT): 80.0000 ug | PREFILLED_SYRINGE | INTRAVENOUS | Status: DC | PRN

## 2019-06-18 MED ORDER — VANCOMYCIN HCL IN DEXTROSE 1-5 GM/200ML-% IV SOLN
1000.0000 mg | Freq: Two times a day (BID) | INTRAVENOUS | Status: DC
  Administered 2019-06-18: 07:00:00 1000 mg via INTRAVENOUS
  Filled 2019-06-18 (×3): qty 200

## 2019-06-18 MED ORDER — BENZOCAINE-MENTHOL 20-0.5 % EX AERO
1.0000 "application " | INHALATION_SPRAY | CUTANEOUS | Status: DC | PRN
  Administered 2019-06-19: 1 via TOPICAL
  Filled 2019-06-18: qty 56

## 2019-06-18 MED ORDER — WITCH HAZEL-GLYCERIN EX PADS: 1.0000 "application " | MEDICATED_PAD | CUTANEOUS | Status: DC | PRN

## 2019-06-18 MED ORDER — OXYTOCIN BOLUS FROM INFUSION
500.0000 mL | Freq: Once | INTRAVENOUS | Status: AC
  Administered 2019-06-18: 16:00:00 500 mL via INTRAVENOUS

## 2019-06-18 MED ORDER — TETANUS-DIPHTH-ACELL PERTUSSIS 5-2.5-18.5 LF-MCG/0.5 IM SUSP
0.5000 mL | Freq: Once | INTRAMUSCULAR | Status: AC
  Administered 2019-06-20: 18:00:00 0.5 mL via INTRAMUSCULAR
  Filled 2019-06-18: qty 0.5

## 2019-06-18 MED ORDER — ONDANSETRON HCL 4 MG/2ML IJ SOLN: 4.0000 mg | INTRAMUSCULAR | Status: DC | PRN

## 2019-06-18 MED ORDER — SOD CITRATE-CITRIC ACID 500-334 MG/5ML PO SOLN: 30.0000 mL | ORAL | Status: DC | PRN

## 2019-06-18 MED ORDER — LABETALOL HCL 5 MG/ML IV SOLN: 80.0000 mg | INTRAVENOUS | Status: DC | PRN

## 2019-06-18 MED ORDER — SENNOSIDES-DOCUSATE SODIUM 8.6-50 MG PO TABS
2.0000 | ORAL_TABLET | ORAL | Status: DC
  Administered 2019-06-19 – 2019-06-21 (×3): 2 via ORAL
  Filled 2019-06-18 (×3): qty 2

## 2019-06-18 MED ORDER — HYDRALAZINE HCL 20 MG/ML IJ SOLN: 10.0000 mg | INTRAMUSCULAR | Status: DC | PRN

## 2019-06-18 MED ORDER — ONDANSETRON HCL 4 MG PO TABS: 4.0000 mg | ORAL_TABLET | ORAL | Status: DC | PRN

## 2019-06-18 MED ORDER — FENTANYL-BUPIVACAINE-NACL 0.5-0.125-0.9 MG/250ML-% EP SOLN: 12.0000 mL/h | EPIDURAL | Status: DC | PRN

## 2019-06-18 MED ORDER — OXYTOCIN 40 UNITS IN NORMAL SALINE INFUSION - SIMPLE MED
2.5000 [IU]/h | INTRAVENOUS | Status: DC
  Filled 2019-06-18: qty 1000

## 2019-06-18 MED ORDER — MAGNESIUM SULFATE 40 GM/1000ML IV SOLN
2.0000 g/h | INTRAVENOUS | Status: AC
  Administered 2019-06-19: 2 g/h via INTRAVENOUS
  Filled 2019-06-18: qty 1000

## 2019-06-18 MED ORDER — MAGNESIUM SULFATE BOLUS VIA INFUSION
4.0000 g | Freq: Once | INTRAVENOUS | Status: AC
  Administered 2019-06-18: 16:00:00 4 g via INTRAVENOUS
  Filled 2019-06-18: qty 1000

## 2019-06-18 MED ORDER — ONDANSETRON HCL 4 MG/2ML IJ SOLN: 4.0000 mg | Freq: Four times a day (QID) | INTRAMUSCULAR | Status: DC | PRN

## 2019-06-18 MED ORDER — MAGNESIUM SULFATE 40 GM/1000ML IV SOLN
INTRAVENOUS | Status: AC
  Filled 2019-06-18: qty 1000

## 2019-06-18 MED ORDER — EPHEDRINE 5 MG/ML INJ: 10.0000 mg | INTRAVENOUS | Status: DC | PRN

## 2019-06-18 MED ORDER — LIDOCAINE HCL (PF) 1 % IJ SOLN: 30.0000 mL | INTRAMUSCULAR | Status: DC | PRN

## 2019-06-18 MED ORDER — PRENATAL MULTIVITAMIN CH
1.0000 | ORAL_TABLET | Freq: Every day | ORAL | Status: DC
  Administered 2019-06-19 – 2019-06-21 (×3): 1 via ORAL
  Filled 2019-06-18 (×3): qty 1

## 2019-06-18 MED ORDER — OXYCODONE-ACETAMINOPHEN 5-325 MG PO TABS: 2.0000 | ORAL_TABLET | ORAL | Status: DC | PRN

## 2019-06-18 MED ORDER — DIPHENHYDRAMINE HCL 25 MG PO CAPS: 25.0000 mg | ORAL_CAPSULE | Freq: Four times a day (QID) | ORAL | Status: DC | PRN

## 2019-06-18 MED ORDER — OXYCODONE-ACETAMINOPHEN 5-325 MG PO TABS: 1.0000 | ORAL_TABLET | ORAL | Status: DC | PRN

## 2019-06-18 MED ORDER — IBUPROFEN 600 MG PO TABS
600.0000 mg | ORAL_TABLET | Freq: Four times a day (QID) | ORAL | Status: DC
  Administered 2019-06-18 – 2019-06-19 (×5): 600 mg via ORAL
  Filled 2019-06-18 (×6): qty 1

## 2019-06-18 MED ORDER — LIDOCAINE HCL (PF) 1 % IJ SOLN
INTRAMUSCULAR | Status: DC | PRN
  Administered 2019-06-18: 11 mL via EPIDURAL

## 2019-06-18 MED ORDER — DIBUCAINE (PERIANAL) 1 % EX OINT: 1.0000 "application " | TOPICAL_OINTMENT | CUTANEOUS | Status: DC | PRN

## 2019-06-18 MED ORDER — DIPHENHYDRAMINE HCL 50 MG/ML IJ SOLN
12.5000 mg | INTRAMUSCULAR | Status: DC | PRN
  Administered 2019-06-18 (×2): 12.5 mg via INTRAVENOUS
  Filled 2019-06-18: qty 1

## 2019-06-18 MED ORDER — FENTANYL-BUPIVACAINE-NACL 0.5-0.125-0.9 MG/250ML-% EP SOLN
EPIDURAL | Status: AC
  Filled 2019-06-18: qty 250

## 2019-06-18 MED ORDER — COCONUT OIL OIL
1.0000 "application " | TOPICAL_OIL | Status: DC | PRN
  Administered 2019-06-19: 1 via TOPICAL

## 2019-06-18 MED ORDER — LACTATED RINGERS IV SOLN: 500.0000 mL | Freq: Once | INTRAVENOUS | Status: DC

## 2019-06-18 MED ORDER — LABETALOL HCL 5 MG/ML IV SOLN: 40.0000 mg | INTRAVENOUS | Status: DC | PRN

## 2019-06-18 MED ORDER — LACTATED RINGERS IV SOLN: 500.0000 mL | INTRAVENOUS | Status: DC | PRN

## 2019-06-18 MED ORDER — SODIUM CHLORIDE (PF) 0.9 % IJ SOLN
INTRAMUSCULAR | Status: DC | PRN
  Administered 2019-06-18: 12 mL/h via EPIDURAL

## 2019-06-18 MED ORDER — SIMETHICONE 80 MG PO CHEW: 80.0000 mg | CHEWABLE_TABLET | ORAL | Status: DC | PRN

## 2019-06-18 MED ORDER — LACTATED RINGERS IV SOLN: INTRAVENOUS | Status: DC

## 2019-06-18 MED ORDER — LABETALOL HCL 5 MG/ML IV SOLN: 20.0000 mg | INTRAVENOUS | Status: DC | PRN

## 2019-06-18 MED ORDER — ACETAMINOPHEN 325 MG PO TABS
650.0000 mg | ORAL_TABLET | ORAL | Status: DC | PRN
  Administered 2019-06-20 – 2019-06-21 (×7): 650 mg via ORAL
  Filled 2019-06-18 (×7): qty 2

## 2019-06-18 NOTE — MAU Note (Signed)
Ctxs since yesterday but stronger since 0230. Denies LOF of vag bleeding. Cervix closed last exam on Friday.

## 2019-06-18 NOTE — Lactation Note (Signed)
This note was copied from a baby's chart. Lactation Consultation Note  Patient Name: Amanda Sanders ASTMH'D Date: 06/18/2019 Reason for consult: Initial assessment;Infant < 6lbs;Early term 37-38.6wks;Primapara Infant in nursery under warmer when first arrived.Mom requested manual pump for home use.  Got mom harmony manual breastpump.  Demo how to use it.  Mom wanted to inititiate pumping with DEBP.  Iniitiated pumping with mom with DEBP.  Prior to pumping demo hand expression on right breast and mom able to teach back on the left breast.  Mom had been pumping with DEBP approximately 5 minutes before infant arrived.  Mom kept pumping for  a few minutes.  Then noticied infant smacking.  Assist with trying to latch infant.  Infant will not latch. Will open a little and just get on nipple tip and smack .  Urged parents to spoon feed back all Expressed breastmilk (5-7 ml if she will take) more if she wants to infant via spoon. Praised breastfeeding.  Urged to feed on cue and 8-12 times day.  Call lactation as needed.  Maternal Data Has patient been taught Hand Expression?: Yes Does the patient have breastfeeding experience prior to this delivery?: No  Feeding Feeding Type: Breast Milk  LATCH Score Latch: Too sleepy or reluctant, no latch achieved, no sucking elicited.  Audible Swallowing: None  Type of Nipple: Everted at rest and after stimulation  Comfort (Breast/Nipple): Soft / non-tender  Hold (Positioning): No assistance needed to correctly position infant at breast.  LATCH Score: 6  Interventions Interventions: Breast feeding basics reviewed;Hand express;Expressed milk;Hand pump;DEBP  Lactation Tools Discussed/Used Pump Review: Setup, frequency, and cleaning;Milk Storage Initiated by:: Wendel Homeyer Date initiated:: 06/18/19   Consult Status Consult Status: Follow-up Date: 06/19/19 Follow-up type: In-patient    [name] Michaelle Copas 06/18/2019, 10:10 PM

## 2019-06-18 NOTE — Progress Notes (Deleted)
Delivery Note Labor onset: 06/18/2019  Labor Onset Time: 0200 Complete dilation at 2:45 PM  Onset of pushing at 1515 FHR second stage Cat 1 Analgesia/Anesthesia intrapartum: Epidural  Guided pushing with maternal urge. Delivery of a viable female at 60. Fetal head delivered in LOA position. Nuchal cord x1 with body cord delivered via somersault maneuver to maternal right. Infant placed on maternal abd, dried, and tactile stim. Lusty cry noted before 1 minute. Cord double clamped and cut after pulsation ended by Richie, father.  Cord blood sample collected Arterial cord blood sample n/a.  Placenta delivered Schultz side, spontaneous, intact, with 3 VC.  Placenta to L&D. Uterine tone firm bleeding scant  No laceration identified.  Anesthesia: n/a Repair: n/a QBL (mL): 11 ml Complications: none APGAR: APGAR (1 MIN): 9   APGAR (5 MINS): 9   APGAR (10 MINS):   Mom to [facility].  Baby to Couplet care / Skin to Skin   Maternal protein creatinine ratio 2.44. New dx pre-eclampsia, negative for neuro S/S. Will start PP MgSO4. Plan discussed w. Dr. Estanislado Pandy.   Roma Schanz MSN, CNM 06/18/2019, 4:10 PM

## 2019-06-18 NOTE — Anesthesia Procedure Notes (Signed)
Epidural Patient location during procedure: OB Start time: 06/18/2019 7:54 AM End time: 06/18/2019 8:08 AM  Staffing Anesthesiologist: Lowella Curb, MD Performed: anesthesiologist   Preanesthetic Checklist Completed: patient identified, IV checked, site marked, risks and benefits discussed, surgical consent, monitors and equipment checked, pre-op evaluation and timeout performed  Epidural Patient position: sitting Prep: ChloraPrep Patient monitoring: heart rate, cardiac monitor, continuous pulse ox and blood pressure Approach: midline Location: L2-L3 Injection technique: LOR saline  Needle:  Needle type: Tuohy  Needle gauge: 17 G Needle length: 9 cm Needle insertion depth: 6 cm Catheter type: closed end flexible Catheter size: 20 Guage Catheter at skin depth: 10 cm Test dose: negative  Assessment Events: blood not aspirated, injection not painful, no injection resistance, no paresthesia and negative IV test  Additional Notes Reason for block:procedure for pain

## 2019-06-18 NOTE — MAU Note (Signed)
Covid swab obtained without difficulty and pt tol well. No symptoms 

## 2019-06-18 NOTE — Progress Notes (Signed)
Dr Hyacinth Meeker notified of new diagnosis of pre-e and platelet count of 96,000 with repeat cbc after delivery.  Orders received that epidural catheter may be removed.

## 2019-06-18 NOTE — Progress Notes (Signed)
19 y.o. G1P0 [redacted]w[redacted]d Admitted for active labor  Subjective:    Comfortable w/ epidural. C/O severe itching after vancomycin infusion, responded well to benadryl. Denies itching, swelling, and SOB currently.    Objective:    VS: BP (!) 130/91   Pulse 67   Temp 97.8 F (36.6 C) (Oral)   Resp 18   Ht 5' (1.524 m)   Wt 64 kg   LMP 09/21/2018 (Exact Date)   BMI 27.54 kg/m  FHR : baseline 135 / variability moderate / accelerations present / absent decelerations Toco: contractions every 2-3 minutes / moderate per palpation Membranes: Intact SVE deferred  Assessment/Plan:   19 y.o. G1P0 [redacted]w[redacted]d  Labor: Progressing normally Preeclampsia:  lab results pending Fetal Wellbeing:  Category I Pain Control:  Epidural I/D:  GBS positive, Vanc x1 dose w/ possible reaction  Anticipated MOD:  NSVD  Meriam Sprague, MSN

## 2019-06-18 NOTE — Anesthesia Preprocedure Evaluation (Signed)

## 2019-06-18 NOTE — H&P (Signed)
Amanda Sanders is a 19 y.o. female. She is a G1P0 at 38.4 weeks evaluated in the MAU in active labor. She reports regular contractions. Denies vb, or lof. Reports adequate fetal movement. She desires an epidural.Her Bps are elevated upon admission. She denies CNS symptoms.   OB History    Gravida  1   Para      Term      Preterm      AB      Living  0     SAB      TAB      Ectopic      Multiple      Live Births             Past Medical History:  Diagnosis Date  . UTI (urinary tract infection)    Past Surgical History:  Procedure Laterality Date  . NO PAST SURGERIES     Family History: family history is not on file. Social History:  reports that she has never smoked. She has never used smokeless tobacco. She reports that she does not drink alcohol or use drugs.     Maternal Diabetes: No Genetic Screening: Declined Maternal Ultrasounds/Referrals: Other: Fetal Ultrasounds or other Referrals:  Other:  Maternal Substance Abuse:  No Significant Maternal Medications:  None Significant Maternal Lab Results:  Group B Strep positive Other Comments:  PT has been noted with thrombocytopenia, and lagging HC. On her most recent US. She is cillin allergic, Clindamycin resistant.  Review of Systems  All other systems reviewed and are negative.  Maternal Medical History:  Reason for admission: Contractions.   Contractions: Onset was 3-5 hours ago.   Frequency: regular.   Perceived severity is moderate.    Fetal activity: Perceived fetal activity is normal.   Last perceived fetal movement was within the past hour.    Prenatal complications: PIH and thrombocytopenia.     Dilation: 5 Effacement (%): 90 Station: -1 Exam by:: jo barham rn Blood pressure (!) 154/103, pulse 69, temperature 97.8 F (36.6 C), temperature source Oral, resp. rate 16, height 5' (1.524 m), weight 64 kg, last menstrual period 09/21/2018.   MAU SVE 5/90/-1 IBOW.   Maternal Exam:   Uterine Assessment: Contraction strength is moderate.  Contraction frequency is irregular.   Abdomen: Patient reports no abdominal tenderness. Fundal height is 39.       Fetal Exam Fetal Monitor Review: Baseline rate: 130.  Variability: moderate (6-25 bpm).   Pattern: no accelerations and no decelerations.    Fetal State Assessment: Category I - tracings are normal.     Physical Exam  Constitutional: She is oriented to person, place, and time. She appears well-developed and well-nourished.  HENT:  Head: Normocephalic.  Eyes: Pupils are equal, round, and reactive to light. Conjunctivae are normal.  Cardiovascular: Regular rhythm.  Respiratory: Effort normal and breath sounds normal.  GI: Soft.  Musculoskeletal:        General: Normal range of motion.     Cervical back: Normal range of motion.  Neurological: She is alert and oriented to person, place, and time. She has normal reflexes.  Skin: Skin is warm and dry.  Psychiatric: She has a normal mood and affect. Her behavior is normal. Judgment and thought content normal.    Prenatal labs: ABO, Rh:O+,  Rubella:  Immune RPR:   Negative HBsAg:   Negative HIV:   Negative GBS:   Positive  Assessment/Plan: 1. Admit from MAU to LDR 2. Begin GBS prophylaxis. Vancomycin  as she is cillin allergic and clindamycin resistant 3. Obtain HTN labs, PCR from foley once placed 4. Epidural as desires  Marveen Reeks Kilynn Fitzsimmons 06/18/2019, 6:30 AM

## 2019-06-18 NOTE — Progress Notes (Signed)
Subjective:    Doing well, comfortable w/ epidural. Denies sequelae from Vancomycin infusion.    Objective:    VS: BP 124/82   Pulse 60   Temp 97.9 F (36.6 C) (Oral)   Resp 20   Ht 5' (1.524 m)   Wt 64 kg   LMP 09/21/2018 (Exact Date)   BMI 27.54 kg/m  FHR : baseline 135 / variability moderate / accelerations present / variable decelerations Toco: contractions every 2-3 minutes  Membranes: SROM, clear @ 1205 Dilation: 8 Effacement (%): 100 Station: 0 Presentation: Vertex Exam by:: [place], rn  Assessment/Plan:   19 y.o. G1P0 [redacted]w[redacted]d  Labor: Progressing normally Preeclampsia:  CMP results normal, PCR pending Fetal Wellbeing:  Category I Pain Control:  Epidural I/D:  GBS pos, Vanc dose x1 Anticipated MOD:  NSVD  Roma Schanz CNM, MSN 06/18/2019 12:40 PM

## 2019-06-18 NOTE — Progress Notes (Signed)
Severe itching started around 0755 sudden onset and vancomycin had just finished infusing.   Pt reported itching on shoulders and head.  Benadryl given with relief. No rash or hives noted.

## 2019-06-19 LAB — CBC WITH DIFFERENTIAL/PLATELET
Abs Immature Granulocytes: 0.04 10*3/uL (ref 0.00–0.07)
Basophils Absolute: 0 10*3/uL (ref 0.0–0.1)
Basophils Relative: 0 %
Eosinophils Absolute: 0 10*3/uL (ref 0.0–0.5)
Eosinophils Relative: 0 %
HCT: 36.4 % (ref 36.0–46.0)
Hemoglobin: 12.3 g/dL (ref 12.0–15.0)
Immature Granulocytes: 0 %
Lymphocytes Relative: 17 %
Lymphs Abs: 1.7 10*3/uL (ref 0.7–4.0)
MCH: 31.6 pg (ref 26.0–34.0)
MCHC: 33.8 g/dL (ref 30.0–36.0)
MCV: 93.6 fL (ref 80.0–100.0)
Monocytes Absolute: 1.1 10*3/uL — ABNORMAL HIGH (ref 0.1–1.0)
Monocytes Relative: 10 %
Neutro Abs: 7.4 10*3/uL (ref 1.7–7.7)
Neutrophils Relative %: 73 %
Platelets: 82 10*3/uL — ABNORMAL LOW (ref 150–400)
RBC: 3.89 MIL/uL (ref 3.87–5.11)
RDW: 14.1 % (ref 11.5–15.5)
WBC: 10.3 10*3/uL (ref 4.0–10.5)
nRBC: 0 % (ref 0.0–0.2)

## 2019-06-19 LAB — COMPREHENSIVE METABOLIC PANEL
ALT: 45 U/L — ABNORMAL HIGH (ref 0–44)
AST: 92 U/L — ABNORMAL HIGH (ref 15–41)
Albumin: 2.4 g/dL — ABNORMAL LOW (ref 3.5–5.0)
Alkaline Phosphatase: 89 U/L (ref 38–126)
Anion gap: 10 (ref 5–15)
BUN: 5 mg/dL — ABNORMAL LOW (ref 6–20)
CO2: 20 mmol/L — ABNORMAL LOW (ref 22–32)
Calcium: 7.3 mg/dL — ABNORMAL LOW (ref 8.9–10.3)
Chloride: 106 mmol/L (ref 98–111)
Creatinine, Ser: 0.68 mg/dL (ref 0.44–1.00)
GFR calc Af Amer: >60 mL/min (ref >60)
GFR calc non Af Amer: >60 mL/min (ref >60)
Glucose, Bld: 85 mg/dL (ref 70–99)
Potassium: 3.9 mmol/L (ref 3.5–5.1)
Sodium: 136 mmol/L (ref 135–145)
Total Bilirubin: 0.3 mg/dL (ref 0.3–1.2)
Total Protein: 5 g/dL — ABNORMAL LOW (ref 6.5–8.1)

## 2019-06-19 MED ORDER — NIFEDIPINE ER OSMOTIC RELEASE 30 MG PO TB24
30.0000 mg | ORAL_TABLET | Freq: Every day | ORAL | Status: DC
  Administered 2019-06-19 – 2019-06-21 (×3): 30 mg via ORAL
  Filled 2019-06-19 (×3): qty 1

## 2019-06-19 NOTE — Progress Notes (Addendum)
PPD#1, SVD with postpartum preeclampsia with severe features.   S: I reviewed the pts VS, elevated BP noted without tx, pt stable, endorses mild HA, mild RUQ pain , denies vision changes. Pt endorses HA feels much better, was worse on the pitocin. Pt denies CP or SOB.   O: Patient Vitals for the past 24 hrs:  BP Temp Temp src Pulse Resp SpO2  06/19/19 1929 (!) 146/94 98.2 F (36.8 C) Oral 71 18 99 %  06/19/19 1735 (!) 139/94 -- -- 79 -- --  06/19/19 1642 (!) 159/108 -- -- 75 -- --  06/19/19 1634 (!) 167/106 -- -- 74 -- --  06/19/19 1628 (!) 165/110 (!) 97.5 F (36.4 C) Oral 86 16 100 %  06/19/19 1230 (!) 143/97 97.9 F (36.6 C) Oral 87 16 100 %  06/19/19 0751 (!) 147/99 98.3 F (36.8 C) Oral 73 16 100 %  06/19/19 0610 -- -- -- -- 16 --  06/19/19 0500 -- -- -- -- 18 --  06/19/19 0359 (!) 138/99 98.3 F (36.8 C) Oral 87 18 100 %  06/19/19 0300 -- -- -- -- 16 --  06/19/19 0200 -- -- -- -- 18 --  06/19/19 0100 -- -- -- -- 18 --  06/19/19 0002 (!) 143/96 98.6 F (37 C) Oral 76 16 100 %  06/18/19 2301 (!) 150/102 98.4 F (36.9 C) Oral 75 18 100 %  06/18/19 2222 (!) 152/104 98.6 F (37 C) Oral 82 18 100 %  06/18/19 2123 (!) 148/105 98.3 F (36.8 C) Oral 91 18 100 %  06/18/19 2029 (!) 157/99 98.6 F (37 C) Oral 80 18 100 %  General: alert, cooperative and no distress Abdomen: mild tenderness RUQ with palpation Neuro: 2+ Patellar DTR, no clonus Lungs: CTA Bi-lat DVT Evaluation: No evidence of DVT seen on physical exam. Extremities: No pitting edema  A/P:   Postpartum preeclampsia: Elevated liver enzymes, low platelet @ 82, d/c'ed magnesium @ 1630 today after 24 hours of administration, discontinued ibuprofen due to platelets, continue Q4H BP, IV labetalol if indicated by BP >160/110, RN to report severe range Bps, started 30mg  XL procardia. Lab recheck in morning if elevated may require another 24 hours of magnesium, pt stable will monitor.   Consulted with Dr and she is  updated on POC and pt status.   [place], Ponzano Di Fermo 06/19/19 8:13 PM

## 2019-06-19 NOTE — Anesthesia Postprocedure Evaluation (Signed)
Anesthesia Post Note  Patient: Amanda Sanders  Procedure(s) Performed: AN AD HOC LABOR EPIDURAL     Patient location during evaluation: Mother Baby Anesthesia Type: Epidural Level of consciousness: awake and alert, oriented and patient cooperative Pain management: pain level controlled Vital Signs Assessment: post-procedure vital signs reviewed and stable Respiratory status: spontaneous breathing Cardiovascular status: stable Postop Assessment: no headache, epidural receding, patient able to bend at knees and no signs of nausea or vomiting Anesthetic complications: no Comments: Pt. States she is walking.  Pain score 2.     Last Vitals:  Vitals:   06/19/19 1634 06/19/19 1642  BP: (!) 167/106 (!) 159/108  Pulse: 74 75  Resp:    Temp:    SpO2:      Last Pain:  Vitals:   06/19/19 1628  TempSrc: Oral  PainSc:    Pain Goal: Patients Stated Pain Goal: 0 (06/18/19 1810)                 Merrilyn Puma

## 2019-06-19 NOTE — Progress Notes (Addendum)
Subjective: Postpartum Day 1: Vaginal delivery, no laceration  Pt on Magnesium Sulfate until 1630 due to postpartum pre eclampsia.   Patient up ad lib, reports no syncope or dizziness.  Denies headache or visual changes.  Having some cramping with nursing that is relieved with ibuprofen. Feeding:  Breastfeeding going well. Contraceptive plan: not discussed  Objective: Vital signs in last 24 hours: Temp:  [97.7 F (36.5 C)-98.6 F (37 C)] 98.3 F (36.8 C) (01/22 0751) Pulse Rate:  [59-91] 73 (01/22 0751) Resp:  [16-20] 16 (01/22 0751) BP: (104-158)/(67-105) 147/99 (01/22 0751) SpO2:  [100 %] 100 % (01/22 0751)  Vitals with BMI 06/19/2019 06/19/2019 06/19/2019  Height - - -  Weight - - -  BMI - - -  Systolic 147 138 811  Diastolic 99 99 96  Pulse 73 87 76   Results for orders placed or performed during the hospital encounter of 06/18/19 (from the past 24 hour(s))  Protein / creatinine ratio, urine     Status: Abnormal   Collection Time: 06/18/19 10:44 AM  Result Value Ref Range   Creatinine, Urine 33.17 mg/dL   Total Protein, Urine 81 mg/dL   Protein Creatinine Ratio 2.44 (H) 0.00 - 0.15 mg/mg[Cre]  CBC     Status: Abnormal   Collection Time: 06/18/19  4:22 PM  Result Value Ref Range   WBC 9.1 4.0 - 10.5 K/uL   RBC 4.20 3.87 - 5.11 MIL/uL   Hemoglobin 13.4 12.0 - 15.0 g/dL   HCT 91.4 78.2 - 95.6 %   MCV 94.3 80.0 - 100.0 fL   MCH 31.9 26.0 - 34.0 pg   MCHC 33.8 30.0 - 36.0 g/dL   RDW 21.3 08.6 - 57.8 %   Platelets 96 (L) 150 - 400 K/uL   nRBC 0.0 0.0 - 0.2 %  Comprehensive metabolic panel     Status: Abnormal   Collection Time: 06/19/19  4:25 AM  Result Value Ref Range   Sodium 136 135 - 145 mmol/L   Potassium 3.9 3.5 - 5.1 mmol/L   Chloride 106 98 - 111 mmol/L   CO2 20 (L) 22 - 32 mmol/L   Glucose, Bld 85 70 - 99 mg/dL   BUN 5 (L) 6 - 20 mg/dL   Creatinine, Ser 4.69 0.44 - 1.00 mg/dL   Calcium 7.3 (L) 8.9 - 10.3 mg/dL   Total Protein 5.0 (L) 6.5 - 8.1 g/dL   Albumin 2.4 (L) 3.5 - 5.0 g/dL   AST 92 (H) 15 - 41 U/L   ALT 45 (H) 0 - 44 U/L   Alkaline Phosphatase 89 38 - 126 U/L   Total Bilirubin 0.3 0.3 - 1.2 mg/dL   GFR calc non Af Amer >60 >60 mL/min   GFR calc Af Amer >60 >60 mL/min   Anion gap 10 5 - 15  CBC with Differential     Status: Abnormal   Collection Time: 06/19/19  4:25 AM  Result Value Ref Range   WBC 10.3 4.0 - 10.5 K/uL   RBC 3.89 3.87 - 5.11 MIL/uL   Hemoglobin 12.3 12.0 - 15.0 g/dL   HCT 62.9 52.8 - 41.3 %   MCV 93.6 80.0 - 100.0 fL   MCH 31.6 26.0 - 34.0 pg   MCHC 33.8 30.0 - 36.0 g/dL   RDW 24.4 01.0 - 27.2 %   Platelets 82 (L) 150 - 400 K/uL   nRBC 0.0 0.0 - 0.2 %   Neutrophils Relative % 73 %   Neutro  Abs 7.4 1.7 - 7.7 K/uL   Lymphocytes Relative 17 %   Lymphs Abs 1.7 0.7 - 4.0 K/uL   Monocytes Relative 10 %   Monocytes Absolute 1.1 (H) 0.1 - 1.0 K/uL   Eosinophils Relative 0 %   Eosinophils Absolute 0.0 0.0 - 0.5 K/uL   Basophils Relative 0 %   Basophils Absolute 0.0 0.0 - 0.1 K/uL   Immature Granulocytes 0 %   Abs Immature Granulocytes 0.04 0.00 - 0.07 K/uL    Physical Exam:  General: alert, cooperative and no distress Lochia: appropriate Uterine Fundus: firm Perineum: intact DVT Evaluation: No evidence of DVT seen on physical exam.   CBC Latest Ref Rng & Units 06/19/2019 06/18/2019 06/18/2019  WBC 4.0 - 10.5 K/uL 10.3 9.1 7.9  Hemoglobin 12.0 - 15.0 g/dL 12.3 13.4 13.4  Hematocrit 36.0 - 46.0 % 36.4 39.6 39.3  Platelets 150 - 400 K/uL 82(L) 96(L) 118(L)     Assessment/Plan: Status post vaginal delivery day 1. Labs reviewed with decrease plt of 82, AST 92, ALT 45, creat .68  UPC 2.44 Stable on Magnesium sulfate and d/c at 1630.   Repeat labs in AM or earlier if worsening signs and symptoms. Continue current care. Plan for discharge tomorrow and Lactation consult    Pleas Koch ProtheroCNM 06/19/2019, 9:24 AM

## 2019-06-19 NOTE — Lactation Note (Signed)
This note was copied from a baby's chart. Lactation Consultation Note  Patient Name: Amanda Sanders JMEQA'[id number] Date: 06/19/2019 Reason for consult: Follow-up assessment;Infant < 6lbs;Early term 37-38.6wks;Primapara;1st time breastfeeding  Baby is 73 hours old  Its been 4 hours since baby fed ( 9 am for 60 mins per mom with swallows)  LC offered to check the baby's diaper / dry and assisted to latch in the side lying position due to mom having a headache.  Easily hand expressed and baby latched with wide open mouth with depth and swallows. Per mom comfortable.  Baby released on her own and nipple well rounded.  LC assisted to offer the right breast / side lying and and baby opened wide and depth achieved. Baby fed for 10 mins with increased swallows. Nipple well rounded when baby released and per mom latch was comfortable.  LC showed dad how to wrap baby in the baby blanket so he could hold her for mom so she could use the rest room.  LC praised mom for her breast feeding efforts.      Maternal Data Has patient been taught Hand Expression?: Yes  Feeding Feeding Type: Breast Fed  LATCH Score Latch: Grasps breast easily, tongue down, lips flanged, rhythmical sucking.  Audible Swallowing: Spontaneous and intermittent  Type of Nipple: Everted at rest and after stimulation  Comfort (Breast/Nipple): Soft / non-tender  Hold (Positioning): Assistance needed to correctly position infant at breast and maintain latch.  LATCH Score: 9  Interventions Interventions: Breast feeding basics reviewed;Assisted with latch;Skin to skin;Breast massage;Reverse pressure;Breast compression;Adjust position;Support pillows;Expressed milk  Lactation Tools Discussed/Used Tools: Pump Breast pump type: Double-Electric Breast Pump WIC Program: Yes   Consult Status Consult Status: Follow-up Date: 06/20/19 Follow-up type: In-patient    Amanda Sanders [name] 06/19/2019, 1:09 PM

## 2019-06-20 LAB — COMPREHENSIVE METABOLIC PANEL
ALT: 53 U/L — ABNORMAL HIGH (ref 0–44)
ALT: 48 U/L — ABNORMAL HIGH (ref 0–44)
AST: 84 U/L — ABNORMAL HIGH (ref 15–41)
AST: 68 U/L — ABNORMAL HIGH (ref 15–41)
Albumin: 2.5 g/dL — ABNORMAL LOW (ref 3.5–5.0)
Albumin: 2.6 g/dL — ABNORMAL LOW (ref 3.5–5.0)
Alkaline Phosphatase: 97 U/L (ref 38–126)
Alkaline Phosphatase: 90 U/L (ref 38–126)
Anion gap: 6 (ref 5–15)
Anion gap: 9 (ref 5–15)
BUN: 6 mg/dL (ref 6–20)
BUN: 5 mg/dL — ABNORMAL LOW (ref 6–20)
CO2: 25 mmol/L (ref 22–32)
CO2: 21 mmol/L — ABNORMAL LOW (ref 22–32)
Calcium: 8.1 mg/dL — ABNORMAL LOW (ref 8.9–10.3)
Calcium: 8.3 mg/dL — ABNORMAL LOW (ref 8.9–10.3)
Chloride: 105 mmol/L (ref 98–111)
Chloride: 106 mmol/L (ref 98–111)
Creatinine, Ser: 0.71 mg/dL (ref 0.44–1.00)
Creatinine, Ser: 0.63 mg/dL (ref 0.44–1.00)
GFR calc Af Amer: >60 mL/min (ref >60)
GFR calc Af Amer: >60 mL/min (ref >60)
GFR calc non Af Amer: >60 mL/min (ref >60)
GFR calc non Af Amer: >60 mL/min (ref >60)
Glucose, Bld: 76 mg/dL (ref 70–99)
Glucose, Bld: 73 mg/dL (ref 70–99)
Potassium: 4.4 mmol/L (ref 3.5–5.1)
Potassium: 4.3 mmol/L (ref 3.5–5.1)
Sodium: 136 mmol/L (ref 135–145)
Sodium: 136 mmol/L (ref 135–145)
Total Bilirubin: 1 mg/dL (ref 0.3–1.2)
Total Bilirubin: 0.9 mg/dL (ref 0.3–1.2)
Total Protein: 5.5 g/dL — ABNORMAL LOW (ref 6.5–8.1)
Total Protein: 5.4 g/dL — ABNORMAL LOW (ref 6.5–8.1)

## 2019-06-20 LAB — CBC
HCT: 36.8 % (ref 36.0–46.0)
HCT: 35.9 % — ABNORMAL LOW (ref 36.0–46.0)
Hemoglobin: 12.3 g/dL (ref 12.0–15.0)
Hemoglobin: 12.3 g/dL (ref 12.0–15.0)
MCH: 31.6 pg (ref 26.0–34.0)
MCH: 31.9 pg (ref 26.0–34.0)
MCHC: 33.4 g/dL (ref 30.0–36.0)
MCHC: 34.3 g/dL (ref 30.0–36.0)
MCV: 94.6 fL (ref 80.0–100.0)
MCV: 93.2 fL (ref 80.0–100.0)
Platelets: 53 10*3/uL — ABNORMAL LOW (ref 150–400)
Platelets: 54 10*3/uL — ABNORMAL LOW (ref 150–400)
RBC: 3.89 MIL/uL (ref 3.87–5.11)
RBC: 3.85 MIL/uL — ABNORMAL LOW (ref 3.87–5.11)
RDW: 14.7 % (ref 11.5–15.5)
RDW: 14.6 % (ref 11.5–15.5)
WBC: 9.4 10*3/uL (ref 4.0–10.5)
WBC: 9.8 10*3/uL (ref 4.0–10.5)
nRBC: 0 % (ref 0.0–0.2)
nRBC: 0 % (ref 0.0–0.2)

## 2019-06-20 NOTE — Lactation Note (Signed)
This note was copied from a baby's chart. Lactation Consultation Note  Patient Name: Amanda Sanders [id number] Date: 06/20/2019 Reason for consult: Follow-up assessment;Early term 37-38.6wks;Primapara;1st time breastfeeding;Infant < 6lbs  P1 mother whose infant is now 11 hours old.  This is an ETI at 38+4 weeks.  Baby was swaddled and in the bassinet when I arrived.  She was sucking on a pacifier.  Mother stated that she fed for 30 minutes at 0715.  Educated mother on pacifier use with breast feeding.  Mother verbalized understanding and will not continue to use the pacifier.  Baby was awake and stirring; offered to assist with latching.  Mother accepted.  Discussed always feeding STS until milk supply is well established and baby is feeding well at the breast.  Asked permission to remove onesie.  Mother hand expressed one drop of colostrum and I assisted baby to latch in the football hold easily.  Once at the breast baby would suck a couple times and push back.  Repeated attempts made but baby was not interested.  She was passing some gas during these attempts.  Suggested baby be swaddled and mother begin pumping.  Mother's breasts are soft and non tender and nipples are everted and intact.  Mother willing to pump.  She has only pumped one time since it was initiated 2 days ago.  Discussed the importance of pumping consistently every three hours to encourage a good milk supply.  Demonstrated breast compressions during pumping and how to make a "hands free" bra.  Observed mother pumping and the #24 flange size is appropriate at this time.  Reviewed pump, pump parts and cleaning.  Wash basin provided.  At the end of the 15 mintues mother had a few drops of colostrum which I finger fed back to baby.  She was calm and swaddled in father's arms.  Mother will continue to feed on cue or at least every three hours followed by 15 minutes of pumping after every feeding.  She will finger feed/spoon feed any  EBM she obtains to baby.  Explained that she may have to supplement if baby does not feed well and/or mother does not obtain enough EBM.  RN in room and aware and will help mother monitor.  Mother does not have a DEBP for home use.  She is a [facility] participant in [facility] and has not decided if she will obtain the formula package or if she will exclusively breast feed.  She wants to wait and see how well baby feeds.  I discussed the [facility] loaner program and mother is interested in doing this at discharge.  It is uncertain whether or not she will be discharged today.  RN explained that she may be a patient until tomorrow; will await doctor's decision.  No referral made at this time since mother is seriously considering the formula package.  Father present.  Mother will call for any further questions/concerns.   Maternal Data Formula Feeding for Exclusion: No Has patient been taught Hand Expression?: Yes Does the patient have breastfeeding experience prior to this delivery?: No  Feeding Feeding Type: Breast Fed  LATCH Score Latch: Repeated attempts needed to sustain latch, nipple held in mouth throughout feeding, stimulation needed to elicit sucking reflex.  Audible Swallowing: None  Type of Nipple: Everted at rest and after stimulation  Comfort (Breast/Nipple): Soft / non-tender  Hold (Positioning): Assistance needed to correctly position infant at breast and maintain latch.  LATCH Score: 6  Interventions Interventions: Breast feeding  basics reviewed;Assisted with latch;Skin to skin;Breast massage;Hand express;Breast compression;Adjust position;DEBP;Hand pump;Position options;Support pillows  Lactation Tools Discussed/Used Tools: Pump Breast pump type: Double-Electric Breast Pump;Manual WIC Program: Yes Pump Review: Setup, frequency, and cleaning;Milk Storage(Reviewed)   Consult Status Consult Status: Follow-up Date: 06/21/19 Follow-up type: In-patient    Kwinton Maahs R  Kora Groom 06/20/2019, 8:52 AM

## 2019-06-20 NOTE — Progress Notes (Signed)
Subjective: Postpartum Day # 2 : S/P NSVD due to spontaneous labor. Pt was dx with PP preeclampsia with severe features and now s/p 24 hours of magnesium as of 1/22 @ 1630. Pt currently endorses mild HA, but endorses improvement since being off magnesium, yesterday had mild RUQ pain tenderness, but today denies RUQ pain , also denies vision changes. Patient up ad lib, denies syncope or dizziness. Reports consuming regular diet without issues and denies N/V. Patient reports 0 bowel movement + passing flatus.  Denies issues with urination and reports bleeding is "lighter."  Patient is breast feeding and reports going well.  Desires pills for postpartum contraception.  Pain is being appropriately managed with use of po meds.   No laceration Feeding:  breast Contraceptive plan:  pills Baby female  Objective: Vital signs in last 24 hours: Patient Vitals for the past 24 hrs:  BP Temp Temp src Pulse Resp SpO2  06/20/19 0845 (!) 137/96 -- -- 84 -- --  06/20/19 0355 136/80 98.3 F (36.8 C) Oral 84 16 100 %  06/20/19 0005 (!) 140/93 98.1 F (36.7 C) Oral 79 18 100 %  06/19/19 2338 (!) 143/87 98.9 F (37.2 C) Oral 82 18 100 %  06/19/19 1929 (!) 146/94 98.2 F (36.8 C) Oral 71 18 99 %  06/19/19 1735 (!) 139/94 -- -- 79 -- --  06/19/19 1642 (!) 159/108 -- -- 75 -- --  06/19/19 1634 (!) 167/106 -- -- 74 -- --  06/19/19 1628 (!) 165/110 (!) 97.5 F (36.4 C) Oral 86 16 100 %  06/19/19 1230 (!) 143/97 97.9 F (36.6 C) Oral 87 16 100 %     Physical Exam:  General: alert, cooperative, appears stated age and no distress Mood/Affect: Happy Lungs: clear to auscultation, no wheezes, rales or rhonchi, symmetric air entry.  Heart: normal rate, regular rhythm, normal S1, S2, no murmurs, rubs, clicks or gallops. Breast: breasts appear normal, no suspicious masses, no skin or nipple changes or axillary nodes. Abdomen:  + bowel sounds, soft, non-tender GU: perineum intact, healing well. No signs of external  hematomas.  Uterine Fundus: firm Lochia: appropriate Skin: Warm, Dry. DVT Evaluation: No evidence of DVT seen on physical exam. Negative Homan's sign. No cords or calf tenderness. No significant calf/ankle edema.  CBC Latest Ref Rng & Units 06/20/2019 06/19/2019 06/18/2019  WBC 4.0 - 10.5 K/uL 9.4 10.3 9.1  Hemoglobin 12.0 - 15.0 g/dL 40.9 81.1 91.4  Hematocrit 36.0 - 46.0 % 36.8 36.4 39.6  Platelets 150 - 400 K/uL 53(L) 82(L) 96(L)    Results for orders placed or performed during the hospital encounter of 06/18/19 (from the past 24 hour(s))  Comprehensive metabolic panel     Status: Abnormal   Collection Time: 06/20/19  5:54 AM  Result Value Ref Range   Sodium 136 135 - 145 mmol/L   Potassium 4.4 3.5 - 5.1 mmol/L   Chloride 105 98 - 111 mmol/L   CO2 25 22 - 32 mmol/L   Glucose, Bld 76 70 - 99 mg/dL   BUN 6 6 - 20 mg/dL   Creatinine, Ser 7.82 0.44 - 1.00 mg/dL   Calcium 8.1 (L) 8.9 - 10.3 mg/dL   Total Protein 5.5 (L) 6.5 - 8.1 g/dL   Albumin 2.5 (L) 3.5 - 5.0 g/dL   AST 84 (H) 15 - 41 U/L   ALT 53 (H) 0 - 44 U/L   Alkaline Phosphatase 97 38 - 126 U/L   Total Bilirubin 1.0 0.3 -  1.2 mg/dL   GFR calc non Af Amer >60 >60 mL/min   GFR calc Af Amer >60 >60 mL/min   Anion gap 6 5 - 15  CBC     Status: Abnormal   Collection Time: 06/20/19  5:54 AM  Result Value Ref Range   WBC 9.4 4.0 - 10.5 K/uL   RBC 3.89 3.87 - 5.11 MIL/uL   Hemoglobin 12.3 12.0 - 15.0 g/dL   HCT 36.8 36.0 - 46.0 %   MCV 94.6 80.0 - 100.0 fL   MCH 31.6 26.0 - 34.0 pg   MCHC 33.4 30.0 - 36.0 g/dL   RDW 14.7 11.5 - 15.5 %   Platelets 53 (L) 150 - 400 K/uL   nRBC 0.0 0.0 - 0.2 %     CBG (last 3)  No results for input(s): GLUCAP in the last 72 hours.   I/O last 3 completed shifts: In: 3253.9 [P.O.:1235; I.V.:2018.9] Out: 9400 [Urine:9400]   Assessment Postpartum Day # 2 : S/P NSVD due to spontaneous labor. Pt was dx with PP preeclampsia with severe features and now s/p 24 hours of magnesium as of  1/22 @ 1630.Adequate urinary output. RUQ pain resolved, mild HA still but endorses feels better. Continues on procardia 30XL, BP currently 137/96.  Pt stable. -2 involution. Breastfeeding.  Labs: AST 31-92-84 ALT 13-45-53 Creatinine 0.71-0.68-0.71 Platelets 118-96-82-52 HGB 13.4-12.3-12.3 PCR 2.4 on 1/21  Plan: Continue other mgmt as ordered PreE with SF; Continue to monitor BP, procardia daily 30mg  xl. Repeat cbc & cmp in 6 hours  VTE prophylactics: Early ambulated as tolerates.  Pain control: Motrin/Tylenol PRN Education given regarding options for contraception, including barrier methods, injectable contraception, IUD placement, oral contraceptives.  Plan for discharge tomorrow, Breastfeeding and Lactation consult   Dr. Charlesetta Garibaldi updated on patient status, consulted and recommended redraw labs in 6 hours, and keep pt in house for 24 more hours.   [facility] NP-C, CNM 06/20/2019, 8:57 AM

## 2019-06-21 LAB — COMPREHENSIVE METABOLIC PANEL
ALT: 49 U/L — ABNORMAL HIGH (ref 0–44)
AST: 63 U/L — ABNORMAL HIGH (ref 15–41)
Albumin: 2.9 g/dL — ABNORMAL LOW (ref 3.5–5.0)
Alkaline Phosphatase: 103 U/L (ref 38–126)
Anion gap: 10 (ref 5–15)
BUN: 7 mg/dL (ref 6–20)
CO2: 21 mmol/L — ABNORMAL LOW (ref 22–32)
Calcium: 9 mg/dL (ref 8.9–10.3)
Chloride: 104 mmol/L (ref 98–111)
Creatinine, Ser: 0.67 mg/dL (ref 0.44–1.00)
GFR calc Af Amer: >60 mL/min (ref >60)
GFR calc non Af Amer: >60 mL/min (ref >60)
Glucose, Bld: 79 mg/dL (ref 70–99)
Potassium: 4.3 mmol/L (ref 3.5–5.1)
Sodium: 135 mmol/L (ref 135–145)
Total Bilirubin: 0.7 mg/dL (ref 0.3–1.2)
Total Protein: 6.4 g/dL — ABNORMAL LOW (ref 6.5–8.1)

## 2019-06-21 LAB — CBC
HCT: 40.5 % (ref 36.0–46.0)
Hemoglobin: 13.4 g/dL (ref 12.0–15.0)
MCH: 31.5 pg (ref 26.0–34.0)
MCHC: 33.1 g/dL (ref 30.0–36.0)
MCV: 95.1 fL (ref 80.0–100.0)
Platelets: 80 10*3/uL — ABNORMAL LOW (ref 150–400)
RBC: 4.26 MIL/uL (ref 3.87–5.11)
RDW: 14.7 % (ref 11.5–15.5)
WBC: 12.1 10*3/uL — ABNORMAL HIGH (ref 4.0–10.5)
nRBC: 0 % (ref 0.0–0.2)

## 2019-06-21 MED ORDER — NIFEDIPINE ER 30 MG PO TB24: 30.0000 mg | ORAL_TABLET | Freq: Every day | ORAL | 1 refills | Status: AC

## 2019-06-21 MED ORDER — BLOOD PRESSURE MONITOR/L CUFF MISC: 0 refills | Status: AC

## 2019-06-21 NOTE — Discharge Summary (Addendum)
SVD OB Discharge Summary     Patient Name: Amanda Sanders DOB: 2000/12/12 MRN: [id number]  Date of admission: 06/18/2019 Delivering MD: Rhea Pink B  Date of delivery: 06/18/2019 Type of delivery: SVD  Newborn Data: Sex: Baby female Live born female  Birth Weight: 5 lb 15.9 oz (2720 g) APGAR: 9, 9  Newborn Delivery   Birth date/time: 06/18/2019 15:39:00 Delivery type: Vaginal, Spontaneous      Feeding: breast Infant being discharge to home with mother in stable condition.   Admitting diagnosis: Normal labor [O80, Z37.9] Intrauterine pregnancy: [redacted]w[redacted]d     Secondary diagnosis:  Active Problems:   Normal labor   Thrombocytopenia (HCC)   Positive GBS test   Preeclampsia   SVD (1/21)   Postpartum care following vaginal delivery                                Complications: None                                                              Intrapartum Procedures: spontaneous vaginal delivery and GBS prophylaxis Postpartum Procedures: s/p 24 hour PP magnesium Complications-Operative and Postpartum: postpartum preeclampsia with severe features.  Augmentation: None   History of Present Illness: Ms. Amanda Sanders is a 19 y.o. female, G1P1001, who presents at [redacted]w[redacted]d weeks gestation. The patient has been followed at  [facility]  Her pregnancy has been complicated by:  Patient Active Problem List   Diagnosis Date Noted  . Normal labor 06/18/2019  . Thrombocytopenia (HCC) 06/18/2019  . Positive GBS test 06/18/2019  . Preeclampsia 06/18/2019  . SVD (1/21) 06/18/2019  . Postpartum care following vaginal delivery 06/18/2019  . Mild concussion 04/08/2018    Hospital course:  Onset of Labor With Vaginal Delivery     19 y.o. yo G1P1001 at [redacted]w[redacted]d was admitted in Active Labor on 06/18/2019. Patient had an uncomplicated labor course as follows:  Membrane Rupture Time/Date: 12:05 PM ,06/18/2019   Intrapartum Procedures: Episiotomy: None [1]                                        Lacerations:  None [1]  Patient had a delivery of a Viable infant. 06/18/2019  Information for the patient's newborn:  Elyce, Zollinger [[id number]]  Delivery Method: Vag-Spont     Pateint had an uncomplicated postpartum course.  She is ambulating, tolerating a regular diet, passing flatus, and urinating well. Patient is discharged home in stable condition on 06/21/19.  Postpartum Day # 3 : S/P NSVD due to spontaneous labor, then developed elevated BP, was dx with ghtn then turned into postpartum preeclampsia with severe features, labs were elevated, with HA, and mild RUQ pain, denies HA, RUQ pain or vision changes now, rem,ains on 30 mg xl procardia daily with BP of 129/91, pt did receive 24 hours PP magnesium, asymptomatic and labs trendeding downwards. . Patient up ad lib, denies syncope or dizziness. Reports consuming regular diet without issues and denies N/V. Patient reports 0 bowel movement + passing flatus.  Denies issues with urination and reports bleeding is "lighter."  Patient  is breastfeeding and reports going well.  Desires pills for postpartum contraception.  Pain is being appropriately managed with use of po meds. Avoid NSAID, ASA or motrin.   Labs: AST 31-92-84-68  Today 63 ALT 13-45-53-54-48  Today 49 Creatinine 0.71-0.68-0.71-0.63  Today 0.67 Platelets 118-96-82-52  Today 80 HGB 13.4-12.3-12.3-12.2  Today 13.4 PCR 2.4 on 1/21  today  Physical exam  Vitals:   06/20/19 1932 06/20/19 2316 06/21/19 0500 06/21/19 0755  BP: 133/82 120/76 132/83 (!) 129/91  Pulse: 83 79 82 94  Resp: 18 18 18 17   Temp: 98.5 F (36.9 C) 98.2 F (36.8 C) 98.1 F (36.7 C) 98.1 F (36.7 C)  TempSrc: Oral Oral Oral Oral  SpO2: 99% 100% 100% 99%  Weight:      Height:       General: alert, cooperative and no distress Lochia: appropriate Uterine Fundus: firm Perineum: INtact DVT Evaluation: No evidence of DVT seen on physical exam. Negative Homan's  sign. No cords or calf tenderness. No significant calf/ankle edema.  Labs: Lab Results  Component Value Date   WBC 12.1 (H) 06/21/2019   HGB 13.4 06/21/2019   HCT 40.5 06/21/2019   MCV 95.1 06/21/2019   PLT 80 (L) 06/21/2019   CMP Latest Ref Rng & Units 06/21/2019  Glucose 70 - 99 mg/dL 79  BUN 6 - 20 mg/dL 7  Creatinine 06/23/2019 - 3.14 mg/dL 9.70  Sodium 2.63 - 785 mmol/L 135  Potassium 3.5 - 5.1 mmol/L 4.3  Chloride 98 - 111 mmol/L 104  CO2 22 - 32 mmol/L 21(L)  Calcium 8.9 - 10.3 mg/dL 9.0  Total Protein 6.5 - 8.1 g/dL 6.4(L)  Total Bilirubin 0.3 - 1.2 mg/dL 0.7  Alkaline Phos 38 - 126 U/L 103  AST 15 - 41 U/L 63(H)  ALT 0 - 44 U/L 49(H)    Date of discharge: 06/21/2019 Discharge Diagnoses: Term Pregnancy-delivered and Postpartum preE with severe features.  Discharge instruction: per After Visit Summary and "Baby and Me Booklet".  After visit meds:   Activity:           unrestricted and pelvic rest Advance as tolerated. Pelvic rest for 6 weeks.  Diet:                routine Medications: PNV, Ibuprofen, Colace and Iron Postpartum contraception: Progesterone only pills Condition:  Pt discharge to home with baby in stable Anemia: Iron at home PreE: monitor BP with BP cuff, report greater then 150/100, take procardia daily, one week BP check, may be virtual if BP taken at home.   Meds: Allergies as of 06/21/2019      Reactions   Penicillins Rash   ALL "CILLINS" PER PATIENT Did it involve swelling of the face/tongue/throat, SOB, or low BP? No Did it involve sudden or severe rash/hives, skin peeling, or any reaction on the inside of your mouth or nose? No Did you need to seek medical attention at a hospital or doctor's office? yes When did it last happen?2018 If all above answers are "NO", may proceed with cephalosporin use.      Medication List    TAKE these medications   acetaminophen 500 MG tablet Commonly known as: TYLENOL Take 500 mg by mouth every 6 (six)  hours as needed for mild pain or headache.   Blood Pressure Monitor/L Cuff Misc Measure BP daily, or if you have  HA, RUQ pain or vision changes, if >150/100 report to [facility]   NIFEdipine 30 MG 24 hr tablet  Commonly known as: ADALAT CC Take 1 tablet (30 mg total) by mouth daily.   prenatal vitamin w/FE, FA 27-1 MG Tabs tablet Take 1 tablet by mouth daily at 12 noon.       Discharge Follow Up:  Follow-up [facility] Follow up.   Specialty: Obstetrics and Gynecology Why: 1 week telehealth BP appointment and 6 weeks PP visit.  Contact information: [address] [place] [id number] Annabella, NP-C, CNM 06/21/2019, 10:43 AM  Noralyn Pick, [place]

## 2019-06-21 NOTE — Lactation Note (Signed)
This note was copied from a baby's chart. Lactation Consultation Note  Patient Name: Amanda Sanders [id number] Date: 06/21/2019 Reason for consult: Follow-up assessment  P1 mother whose infant is now 81 hours old.  This is an ETI at 38+4 weeks weighing < 6 lbs.  Mother was excited to see me today and inform me that she has kept up with the plan we established yesterday.  She has been pumping consistently and has recently pumped 10 mls of EBM which she had at her bedside table.  She will feed this back to baby at the next feeding.  Praised her for continuing to be diligent about pumping.  Discussed supplementation volume guidelines.  Mother will be providing breast milk and supplementing as needed.  She is still undecided about whether or not to obtain the "formula" package from [facility] or to obtain the DEBP; I get the impression she wants to obtain the formula but also plans to purchase her own DEBP.  Information provided regarding pump purchase and reiterated the [facility] loaner program if she chooses to do this at discharge today.  She will contact her RN if she decides to rent.  Engorgement prevention/treatment discussed.  Mother has a manual pump for home use.  Father present.  Family is looking forward to being discharged.  Encouraged mother to keep up with the pumping and to call for any questions/concerns she may have.  Mother verbalized understanding.     Maternal Data    Feeding Feeding Type: Breast Fed  LATCH Score                   Interventions    Lactation Tools Discussed/Used Tools: Supplemental Nutrition System Breast pump type: Double-Electric Breast Pump   Consult Status Consult Status: Complete Date: 06/21/19 Follow-up type: Call as needed    Sherika Kubicki R Karo Rog 06/21/2019, 11:27 AM
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
## 2019-10-29 ENCOUNTER — Ambulatory Visit: Payer: Medicaid Other | Attending: Family

## 2019-10-29 DIAGNOSIS — Z23 Encounter for immunization: Secondary | ICD-10-CM

## 2019-11-18 NOTE — Progress Notes (Signed)
   Covid-19 Vaccination Clinic  Name:  Farris Blash    MRN: [id number] DOB: 2001-01-13  11/18/2019  Ms. Crisp was observed post Covid-19 immunization for 15 minutes without incident. She was provided with Vaccine Information Sheet and instruction to access the V-Safe system.   Ms. Orlov was instructed to call 911 with any severe reactions post vaccine: Marland Kitchen Difficulty breathing  . Swelling of face and throat  . A fast heartbeat  . A bad rash all over body  . Dizziness and weakness   Immunizations Administered    Name Date Dose VIS Date Route   Pfizer COVID-19 Vaccine 10/29/2019  4:33 PM 0.3 mL 07/22/2018 Intramuscular   Manufacturer: [employer], [employer]   Lot: [id number]   NDC: [address]-4

## 2019-11-24 ENCOUNTER — Ambulatory Visit: Payer: Medicaid Other

## 2019-12-01 ENCOUNTER — Ambulatory Visit: Payer: Medicaid Other | Attending: Family

## 2019-12-01 DIAGNOSIS — Z23 Encounter for immunization: Secondary | ICD-10-CM

## 2019-12-01 NOTE — Progress Notes (Signed)
   Covid-19 Vaccination Clinic  Name:  Amanda Sanders    MRN: [id number] DOB: [birth year]  12/01/2019  [name] was observed post Covid-19 immunization for 15 minutes without incident. She was provided with Vaccine Information Sheet and instruction to access the V-Safe system.   [name] was instructed to call 911 with any severe reactions post vaccine: Marland Kitchen Difficulty breathing  . Swelling of face and throat  . A fast heartbeat  . A bad rash all over body  . Dizziness and weakness   Immunizations Administered    Name Date Dose VIS Date Route   Moderna COVID-19 Vaccine 12/01/2019  2:03 PM 0.5 mL 04/2019 Intramuscular   Manufacturer: Moderna   Lot: [id number]   NDC: 79892-119-41

## 2020-05-29 IMAGING — US US MFM FETAL NUCHAL TRANSLUCENCY
1 series · 13 of 28 positions shown · non-contrast
Comparison: none

[Series 1: us mfm fetal nuchal translucency · 13 of 30 slices shown]
[im 2/30]
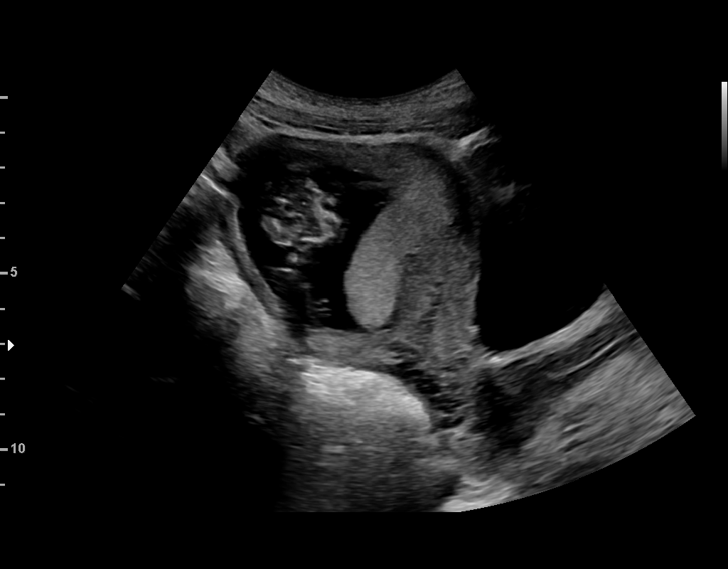
[im 4/30]
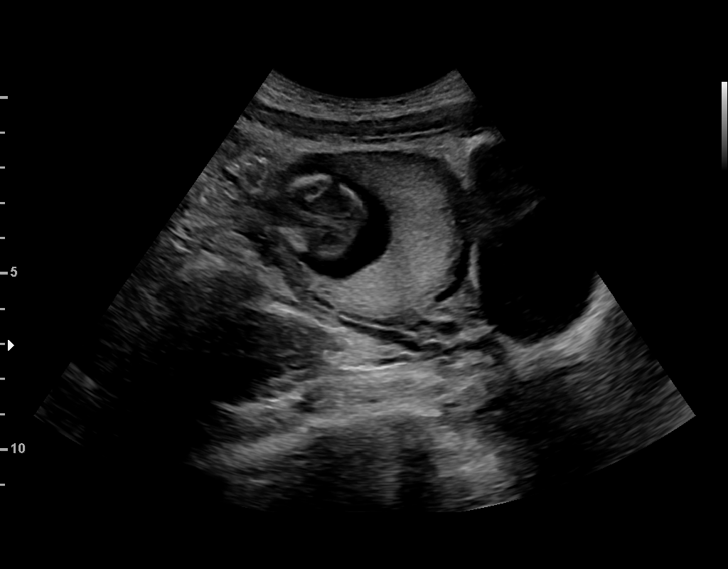
[im 6/30]
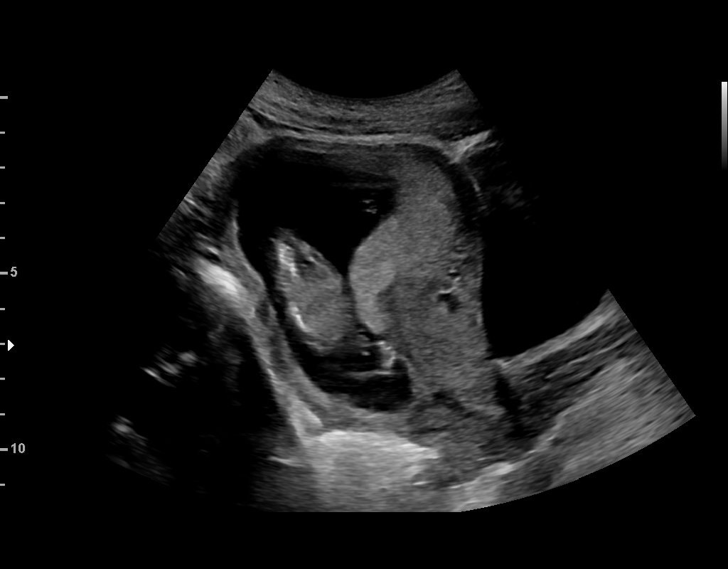
[im 8/30]
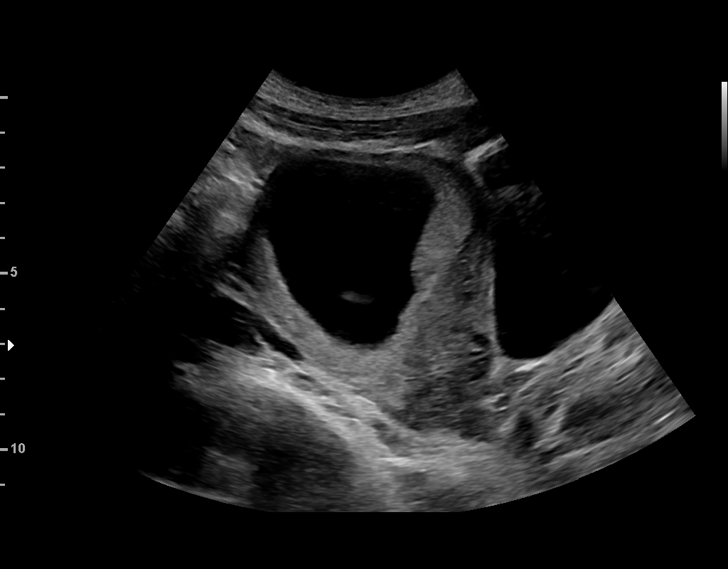
[im 10/30]
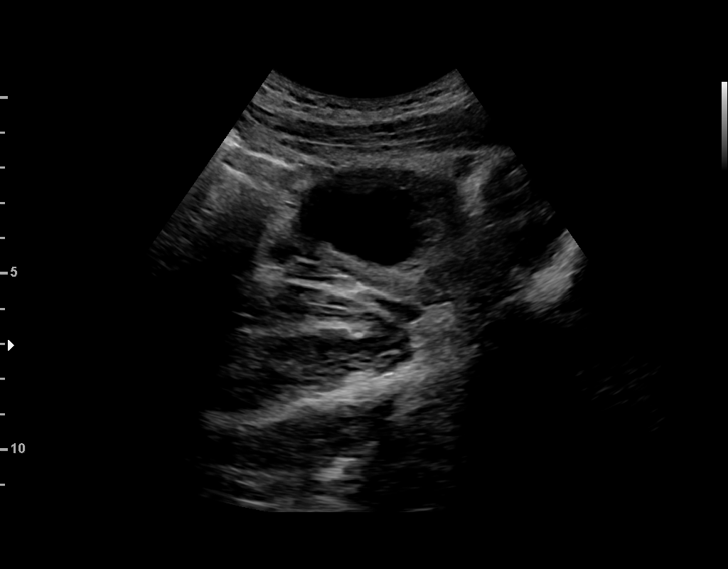
[im 12/30]
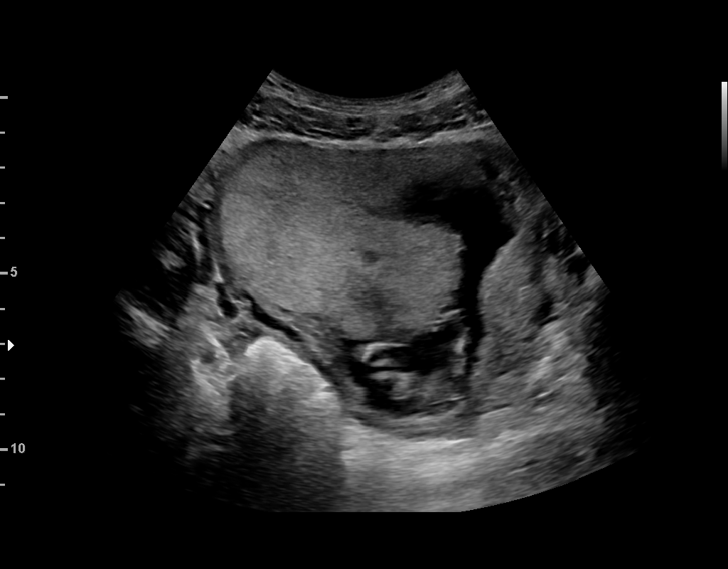
[im 16/30]
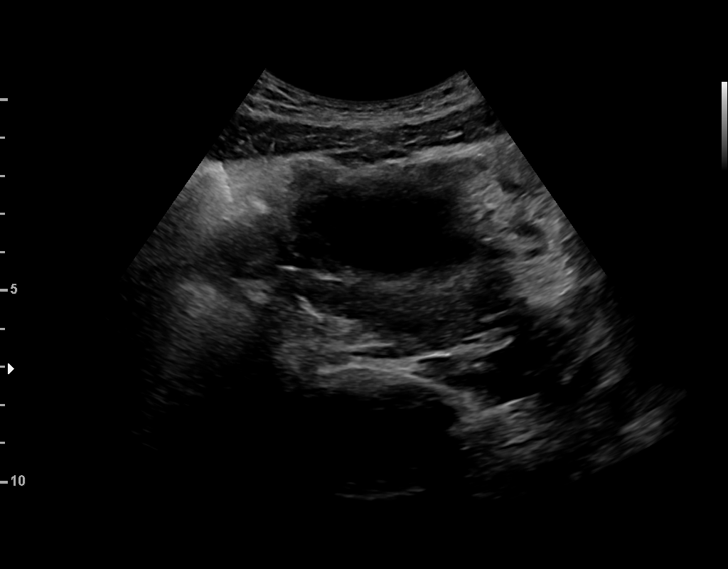
[im 18/30]
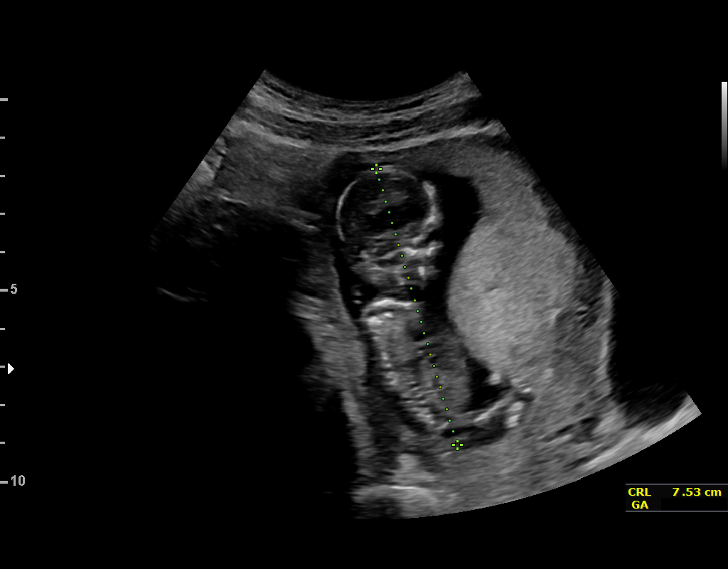
[im 20/30]
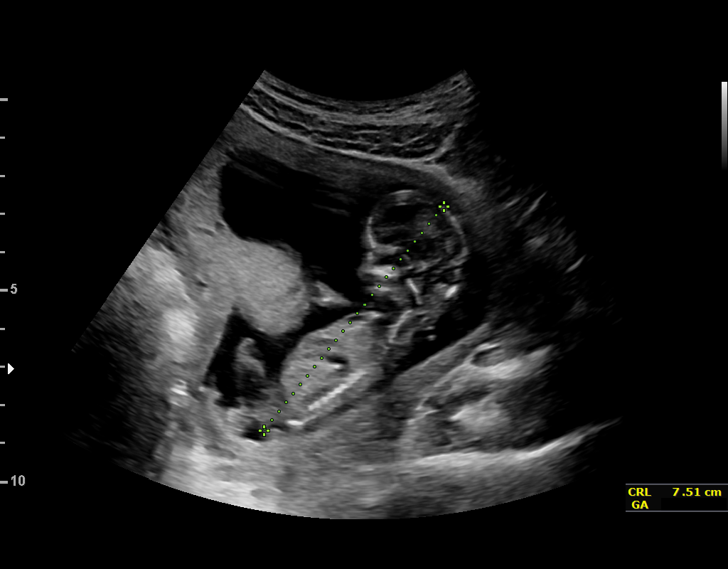
[im 22/30]
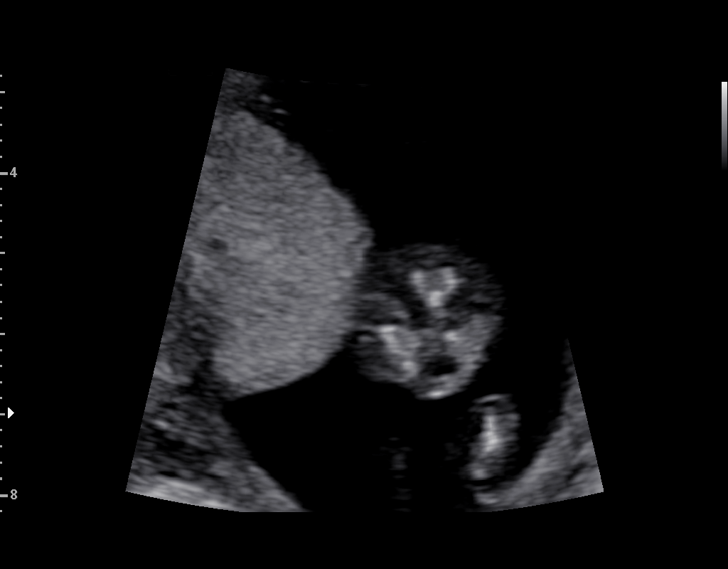
[im 24/30]
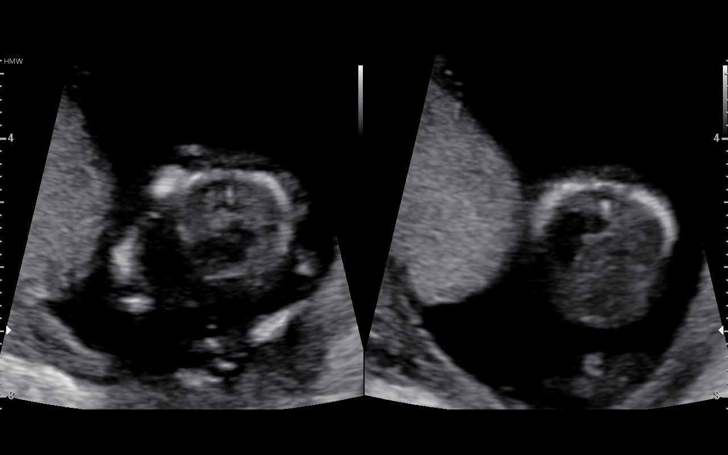
[im 26/30]
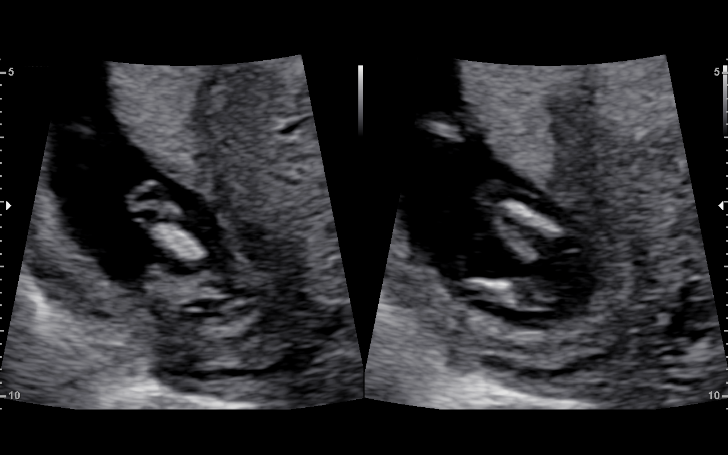
[im 28/30]
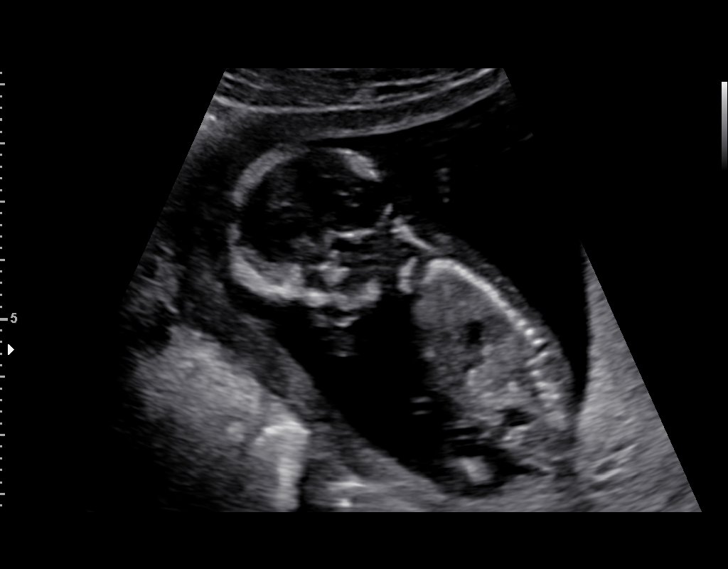

[13 of 28 positions shown; findings below may reference images not displayed]

[REDACTED]-
                   Faculty Physician

     TRANSLUCENCY
 ----------------------------------------------------------------------

 ----------------------------------------------------------------------
Indications

  13 weeks gestation of pregnancy
  Encounter for nuchal translucency
 ----------------------------------------------------------------------
Fetal Evaluation

 Num Of Fetuses:         1
 Preg. Location:         Intrauterine
 Gest. Sac:              Intrauterine
 Yolk Sac:               Not visualized
 Fetal Pole:             Visualized
 Fetal Heart Rate(bpm):  155
 Cardiac Activity:       Observed

 Amniotic Fluid
 AFI FV:      Within normal limits
Biometry

 CRL:      73.7  mm     G. Age:  13w 2d                  EDD:   06/30/19

 NT:        1.3  mm
OB History

 Gravidity:    1         Term:   0        Prem:   0        SAB:   0
 TOP:          0       Ectopic:  0        Living: 0
Gestational Age
 LMP:           13w 4d        Date:  09/21/18                 EDD:   06/28/19
 Best:          13w 4d     Det. By:  LMP  (09/21/18)          EDD:   06/28/19
1st Trimester Genetic Sonogram Screening

 CRL:            73.7  mm    G. Age:   13w 2d                 EDD:   06/30/19
 Nuc Trans:       1.3  mm

 Nasal Bone:                 Present
Anatomy

 Choroid Plexus:        Appears normal         Bladder:                Appears normal
 Thoracic:              Appears normal         Upper Extremities:      Visualized
 Stomach:               Appears normal, left   Lower Extremities:      Visualized
                        sided
Cervix Uterus Adnexa

 Cervix
 Normal appearance by transabdominal scan.

 Uterus
 No abnormality visualized.

 Left Ovary
 No adnexal mass visualized.

 Right Ovary
 No adnexal mass visualized.

 Cul De Sac
 No free fluid seen.

 Adnexa
 No abnormality visualized.
Impression

 On ultrasound, the CRL measurement is consistent with her
 previously-established dates and good fetal heart activity is
 seen. The nuchal translucency (NT) measures
 millimeters, which is normal.  Fetal anatomy that could be
 ascertained at this gestational age is normal.
 Blood was drawn today for first-trimester screening. We will
 communicate the results to the patient and fax a copy to your
 office.
Recommendations

 Fetal anatomy scan at 18-20 weeks.
                 Lilek, Nadalv

## 2020-11-01 ENCOUNTER — Other Ambulatory Visit: Payer: Self-pay

## 2020-11-01 ENCOUNTER — Encounter (HOSPITAL_COMMUNITY): Payer: Self-pay

## 2020-11-01 ENCOUNTER — Ambulatory Visit (HOSPITAL_COMMUNITY)
Admission: EM | Admit: 2020-11-01 | Discharge: 2020-11-01 | Disposition: A | Discharge status: Home / Self Care | Payer: Medicaid Other | Attending: Urgent Care | Admitting: Urgent Care

## 2020-11-01 DIAGNOSIS — L301 Dyshidrosis [pompholyx]: Secondary | ICD-10-CM

## 2020-11-01 MED ORDER — CLOBETASOL PROPIONATE 0.05 % EX CREA: 1.0000 "application " | TOPICAL_CREAM | Freq: Two times a day (BID) | CUTANEOUS | 0 refills | Status: AC

## 2020-11-01 NOTE — ED Provider Notes (Signed)
[facility]   MRN: [id number] DOB: [birth year]  Subjective:   Amanda Sanders is a 20 y.o. female presenting for medication refill.  Has difficulty with dyshidrotic eczema and is previously used clobetasol steroid cream with good relief.  She is seeing a dermatologist at [facility] but they are not able to see her for couple months.  She is requesting a refill.  She is doing her best to keep her hands dry.    No current facility-administered medications for this encounter.  Current Outpatient Medications:  .  acetaminophen (TYLENOL) 500 MG tablet, Take 500 mg by mouth every 6 (six) hours as needed for mild pain or headache., Disp: , Rfl:  .  Blood Pressure Monitoring (BLOOD PRESSURE MONITOR/L CUFF) MISC, Measure BP daily, or if you have  HA, RUQ pain or vision changes, if >150/100 report to [facility], Disp: 1 each, Rfl: 0 .  NIFEdipine (ADALAT CC) 30 MG 24 hr tablet, Take 1 tablet (30 mg total) by mouth daily., Disp: 30 tablet, Rfl: 1 .  prenatal vitamin w/FE, FA (PRENATAL 1 + 1) 27-1 MG TABS tablet, Take 1 tablet by mouth daily at 12 noon., Disp: , Rfl:     Allergies  Allergen Reactions  . Penicillins Rash    ALL "CILLINS" PER PATIENT Did it involve swelling of the face/tongue/throat, SOB, or low BP? No Did it involve sudden or severe rash/hives, skin peeling, or any reaction on the inside of your mouth or nose? No Did you need to seek medical attention at a hospital or doctor's office? yes When did it last happen?2018 If all above answers are "NO", may proceed with cephalosporin use.    Past Medical History:  Diagnosis Date  . UTI (urinary tract infection)      Past Surgical History:  Procedure Laterality Date  . NO PAST SURGERIES      Family History  Problem Relation Age of Onset  . Healthy Mother   . Healthy Father     Social History   Tobacco Use  . Smoking status: Never Smoker  . Smokeless tobacco: Never Used  Vaping Use  .  Vaping Use: Never used  Substance Use Topics  . Alcohol use: Never  . Drug use: Never    ROS   Objective:   Vitals: BP (!) 123/59 (BP Location: Right Arm)   Pulse 98   Temp 98.3 F (36.8 C) (Oral)   Resp 20   LMP 10/11/2020 (Approximate)   SpO2 100%   Physical Exam Constitutional:      General: She is not in acute distress.    Appearance: Normal appearance. She is well-developed. She is not ill-appearing, toxic-appearing or diaphoretic.  HENT:     Head: Normocephalic and atraumatic.     Nose: Nose normal.     Mouth/Throat:     Mouth: Mucous membranes are moist.     Pharynx: Oropharynx is clear.  Eyes:     General: No scleral icterus.    Extraocular Movements: Extraocular movements intact.     Pupils: Pupils are equal, round, and reactive to light.  Cardiovascular:     Rate and Rhythm: Normal rate.  Pulmonary:     Effort: Pulmonary effort is normal.  Musculoskeletal:       Hands:  Skin:    General: Skin is warm and dry.  Neurological:     General: No focal deficit present.     Mental Status: She is alert and oriented to person, place,  and time.  Psychiatric:        Mood and Affect: Mood normal.        Behavior: Behavior normal.        Thought Content: Thought content normal.        Judgment: Judgment normal.     Assessment and Plan :   PDMP not reviewed this encounter.  1. Dyshidrotic eczema     Refilled clobetasol and recommended following up with her dermatologist or a new provider at [facility] dermatology center.  Counseled on general management of dyshidrotic eczema. Counseled patient on potential for adverse effects with medications prescribed/recommended today, ER and return-to-clinic precautions discussed, patient verbalized understanding.    Wallis Bamberg, [place] 11/01/20 1609

## 2020-11-01 NOTE — ED Triage Notes (Signed)
Pt is here today to get a medication refill. Pt states she has eczema and states she is not able to see the dermatologist until August. She states her hands are cracking.

## 2021-10-31 ENCOUNTER — Encounter (HOSPITAL_COMMUNITY): Payer: Self-pay | Admitting: Emergency Medicine

## 2021-10-31 ENCOUNTER — Emergency Department (HOSPITAL_COMMUNITY)
Admission: EM | Admit: 2021-10-31 | Discharge: 2021-10-31 | Disposition: A | Discharge status: Home / Self Care | Payer: Medicaid Other | Attending: Emergency Medicine | Admitting: Emergency Medicine

## 2021-10-31 DIAGNOSIS — M542 Cervicalgia: Secondary | ICD-10-CM | POA: Diagnosis not present

## 2021-10-31 NOTE — Discharge Instructions (Signed)
Ibuprofen for discomfort  
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
## 2022-02-19 ENCOUNTER — Other Ambulatory Visit: Payer: Self-pay

## 2022-02-19 ENCOUNTER — Encounter (HOSPITAL_BASED_OUTPATIENT_CLINIC_OR_DEPARTMENT_OTHER): Payer: Self-pay | Admitting: Emergency Medicine

## 2022-02-19 ENCOUNTER — Emergency Department (HOSPITAL_BASED_OUTPATIENT_CLINIC_OR_DEPARTMENT_OTHER): Payer: BC Managed Care – PPO

## 2022-02-19 ENCOUNTER — Emergency Department (HOSPITAL_BASED_OUTPATIENT_CLINIC_OR_DEPARTMENT_OTHER)
Admission: EM | Admit: 2022-02-19 | Discharge: 2022-02-19 | Disposition: A | Discharge status: Home / Self Care | Payer: BC Managed Care – PPO | Attending: Emergency Medicine | Admitting: Emergency Medicine

## 2022-02-19 DIAGNOSIS — R519 Headache, unspecified: Secondary | ICD-10-CM | POA: Diagnosis present

## 2022-02-19 LAB — URINALYSIS, ROUTINE W REFLEX MICROSCOPIC
Bilirubin Urine: NEGATIVE
Glucose, UA: NEGATIVE mg/dL
Hgb urine dipstick: NEGATIVE
Ketones, ur: NEGATIVE mg/dL
Leukocytes,Ua: NEGATIVE
Nitrite: POSITIVE — AB
Protein, ur: NEGATIVE mg/dL
Specific Gravity, Urine: 1.024 (ref 1.005–1.030)
pH: 7.5 (ref 5.0–8.0)

## 2022-02-19 LAB — CBC WITH DIFFERENTIAL/PLATELET
Abs Immature Granulocytes: 0.01 10*3/uL (ref 0.00–0.07)
Basophils Absolute: 0 10*3/uL (ref 0.0–0.1)
Basophils Relative: 0 %
Eosinophils Absolute: 0.1 10*3/uL (ref 0.0–0.5)
Eosinophils Relative: 1 %
HCT: 35.7 % — ABNORMAL LOW (ref 36.0–46.0)
Hemoglobin: 12.2 g/dL (ref 12.0–15.0)
Immature Granulocytes: 0 %
Lymphocytes Relative: 41 %
Lymphs Abs: 3.1 10*3/uL (ref 0.7–4.0)
MCH: 32.3 pg (ref 26.0–34.0)
MCHC: 34.2 g/dL (ref 30.0–36.0)
MCV: 94.4 fL (ref 80.0–100.0)
Monocytes Absolute: 0.5 10*3/uL (ref 0.1–1.0)
Monocytes Relative: 7 %
Neutro Abs: 3.9 10*3/uL (ref 1.7–7.7)
Neutrophils Relative %: 51 %
Platelets: 255 10*3/uL (ref 150–400)
RBC: 3.78 MIL/uL — ABNORMAL LOW (ref 3.87–5.11)
RDW: 12.5 % (ref 11.5–15.5)
WBC: 7.6 10*3/uL (ref 4.0–10.5)
nRBC: 0 % (ref 0.0–0.2)

## 2022-02-19 LAB — PREGNANCY, URINE: Preg Test, Ur: NEGATIVE

## 2022-02-19 LAB — COMPREHENSIVE METABOLIC PANEL
ALT: 12 U/L (ref 0–44)
AST: 16 U/L (ref 15–41)
Albumin: 4.3 g/dL (ref 3.5–5.0)
Alkaline Phosphatase: 37 U/L — ABNORMAL LOW (ref 38–126)
Anion gap: 6 (ref 5–15)
BUN: 15 mg/dL (ref 6–20)
CO2: 26 mmol/L (ref 22–32)
Calcium: 9.5 mg/dL (ref 8.9–10.3)
Chloride: 106 mmol/L (ref 98–111)
Creatinine, Ser: 0.67 mg/dL (ref 0.44–1.00)
GFR, Estimated: >60 mL/min (ref >60)
Glucose, Bld: 95 mg/dL (ref 70–99)
Potassium: 4 mmol/L (ref 3.5–5.1)
Sodium: 138 mmol/L (ref 135–145)
Total Bilirubin: 0.3 mg/dL (ref 0.3–1.2)
Total Protein: 7.3 g/dL (ref 6.5–8.1)

## 2022-02-19 NOTE — ED Triage Notes (Signed)
Migraine behind right eye. Started about 1 year ago, has been taking 800mg  ibuprofen daily. With some relief. Comes today due to concerned family members for prolonged use of nsaids  Denies any GI symptom,   No other complaints at this time

## 2022-02-19 NOTE — ED Provider Notes (Signed)
Care assumed from [place] at shift change, please see his note for full details, but in brief Amanda Sanders is a 21 y.o. female who presents for evaluation of frequent headaches, patient reports that if she does take 800 mg of ibuprofen in the morning and before bed she has a headache.  Headache is located behind the eyes typically when she has it.  Not associated with any visual changes, numbness or weakness.  Denies any current headache.  Family was concerned and encouraged her to come in for evaluation.  Lab evaluation overall unremarkable, CT of the head ordered, if normal plan for discharge with outpatient neurology follow-up.  BP (!) 128/92 (BP Location: Right Arm)   Pulse 88   Temp 98.5 F (36.9 C)   Resp 12   LMP 01/22/2022 (Exact Date) Comment: neg preg test  SpO2 100%    Procedures  Procedures  ED Course / MDM    Medical Decision Making Amount and/or Complexity of Data Reviewed Labs: ordered. Radiology: ordered.   I have personally viewed and interpreted head CT with no acute bleeding or intracranial mass or other acute abnormality.  Patient currently denies headache.  Neurology referral placed.  I discussed reassuring results with patient.  Discussed that it is okay for her to continue taking ibuprofen as needed, and discussed plans for neurology referral.  Patient expressed understanding and agreement.  At this time there does not appear to be any evidence of an acute emergency medical condition requiring further emergent evaluation and the patient appears stable for discharge with appropriate outpatient follow up. Diagnosis and return precautions discussed with patient who verbalizes understanding and is agreeable to discharge.          Jacqlyn Larsen, PA-C 02/19/22 2032    Deno Etienne, DO 02/19/22 2314

## 2022-02-19 NOTE — ED Provider Notes (Signed)
[facility] EMERGENCY DEPT Provider Note   CSN: [id number] Arrival date & time: 02/19/22  1704     History  Chief Complaint  Patient presents with   Migraine    Amanda Sanders is a 21 y.o. female.  Patient presents to the hospital due to family concerns about her chronic NSAID usage.  Patient reports a sharp, stabbing headache behind the right eye which has been present for over a year.  Patient states that she takes 800 mg of ibuprofen upon waking and 800 mg of ibuprofen prior to bed to avoid the headaches.  She states that if she misses her ibuprofen she will experience sharp headache later in the day or in the middle of the night.  Her family is concerned that her chronic NSAID use will cause stomach issues.  She denies an active headache at this time.  She denies photophobia or visual disturbances with the headache.  She denies any trauma or injury prior to the onset of these headaches.  The patient does have a remote concussion injury.  HPI     Home Medications Prior to Admission medications   Medication Sig Start Date End Date Taking? Authorizing Provider  acetaminophen (TYLENOL) 500 MG tablet Take 500 mg by mouth every 6 (six) hours as needed for mild pain or headache.    [provider]  Blood Pressure Monitoring (BLOOD PRESSURE MONITOR/L CUFF) MISC Measure BP daily, or if you have  HA, RUQ pain or vision changes, if >150/100 report to [facility] 06/21/19 08/26/19  Noralyn Pick, FNP  clobetasol cream (TEMOVATE) 9.67 % Apply 1 application topically 2 (two) times daily. 11/01/20   Jaynee Eagles, PA-C  NIFEdipine (ADALAT CC) 30 MG 24 hr tablet Take 1 tablet (30 mg total) by mouth daily. 06/21/19   Noralyn Pick, [place]  prenatal vitamin w/FE, FA (PRENATAL 1 + 1) 27-1 MG TABS tablet Take 1 tablet by mouth daily at 12 noon.    [provider]      Allergies    Penicillins    Review of Systems   Review of Systems  Neurological:  Positive for headaches.     Physical Exam Updated Vital Signs BP (!) 128/92 (BP Location: Right Arm)   Pulse 88   Temp 98.5 F (36.9 C)   Resp 12   LMP 01/22/2022 (Exact Date)   SpO2 100%  Physical Exam Vitals and nursing note reviewed.  Constitutional:      General: She is not in acute distress.    Appearance: She is well-developed.  HENT:     Head: Normocephalic and atraumatic.  Eyes:     Extraocular Movements: Extraocular movements intact.     Conjunctiva/sclera: Conjunctivae normal.     Pupils: Pupils are equal, round, and reactive to light.  Cardiovascular:     Rate and Rhythm: Normal rate.  Pulmonary:     Effort: Pulmonary effort is normal. No respiratory distress.  Abdominal:     Palpations: Abdomen is soft.     Tenderness: There is no abdominal tenderness.  Musculoskeletal:        General: No swelling.     Cervical back: Neck supple.  Skin:    General: Skin is warm and dry.     Capillary Refill: Capillary refill takes less than 2 seconds.  Neurological:     General: No focal deficit present.     Mental Status: She is alert.     Cranial Nerves: No cranial nerve deficit.  Psychiatric:  Mood and Affect: Mood normal.     ED Results / Procedures / Treatments   Labs (all labs ordered are listed, but only abnormal results are displayed) Labs Reviewed  CBC WITH DIFFERENTIAL/PLATELET - Abnormal; Notable for the following components:      Result Value   RBC 3.78 (*)    HCT 35.7 (*)    All other components within normal limits  COMPREHENSIVE METABOLIC PANEL - Abnormal; Notable for the following components:   Alkaline Phosphatase 37 (*)    All other components within normal limits  PREGNANCY, URINE  URINALYSIS, ROUTINE W REFLEX MICROSCOPIC    EKG None  Radiology No results found.  Procedures Procedures    Medications Ordered in ED Medications - No data to display  ED Course/ Medical Decision Making/ A&P                           Medical Decision Making Amount  and/or Complexity of Data Reviewed Labs: ordered. Radiology: ordered.   This patient presents to the ED for concern of chronic headaches, this involves an extensive number of treatment options, and is a complaint that carries with it a high risk of complications and morbidity.  The differential diagnosis includes migraine, cluster headache, other headache disorder, medication dependence, malignancy, others   Co morbidities that complicate the patient evaluation  History of remote concussion   Additional history obtained:   External records from outside source obtained and reviewed including notes documenting previous concussion in approximately 2015   Lab Tests:  I Ordered, and personally interpreted labs.  The pertinent results include: Negative pregnancy test, unremarkable CBC, unremarkable CMP   Imaging Studies ordered:  I ordered imaging studies including CT head without contrast   Test / Admission - Considered:  The patient is asymptomatic at this time.  I did talk to the patient about GI risks of long-term NSAID usage.  My chief concern with the patient is because of her chronic headaches.  CT head scan ordered and pending at this time.  Patient care being handed to Benedetto Goad, PA-C at shift handoff.  If CT scan is benign, plan to discharge patient home with recommendations for neurology follow-up.          Final Clinical Impression(s) / ED Diagnoses Final diagnoses:  Headache behind the eyes    Rx / DC Orders ED Discharge Orders     None         Ronny Bacon 02/19/22 1845    Deno Etienne, DO 02/19/22 2005

## 2022-02-19 NOTE — Discharge Instructions (Addendum)
Your CT scan and lab work today look okay.  It is okay to keep taking ibuprofen twice a day as needed for headaches.  I would like for you to follow-up with neurology for continued evaluation of these persistent headaches.  I have placed a referral but if you do not hear from them in the next few days please call their office to schedule a follow-up appointment.

## 2022-02-19 NOTE — ED Notes (Signed)
RN provided AVS using Teachback Method. Patient verbalizes understanding of Discharge Instructions. Opportunity for Questioning and Answers were provided by RN. Patient Discharged from ED ambulatory to Home. ° °

## 2022-07-14 ENCOUNTER — Ambulatory Visit
Admission: EM | Admit: 2022-07-14 | Discharge: 2022-07-14 | Disposition: A | Discharge status: Home / Self Care | Payer: BC Managed Care – PPO | Attending: Urgent Care | Admitting: Urgent Care

## 2022-07-14 DIAGNOSIS — N898 Other specified noninflammatory disorders of vagina: Secondary | ICD-10-CM | POA: Insufficient documentation

## 2022-07-14 DIAGNOSIS — N3000 Acute cystitis without hematuria: Secondary | ICD-10-CM | POA: Diagnosis not present

## 2022-07-14 DIAGNOSIS — N760 Acute vaginitis: Secondary | ICD-10-CM

## 2022-07-14 DIAGNOSIS — B3731 Acute candidiasis of vulva and vagina: Secondary | ICD-10-CM | POA: Insufficient documentation

## 2022-07-14 LAB — POCT URINALYSIS DIP (MANUAL ENTRY)
Bilirubin, UA: NEGATIVE
Blood, UA: NEGATIVE
Glucose, UA: NEGATIVE mg/dL
Nitrite, UA: POSITIVE — AB
Protein Ur, POC: NEGATIVE mg/dL
Spec Grav, UA: >=1.03 — AB (ref 1.010–1.025)
Urobilinogen, UA: 0.2 E.U./dL
pH, UA: 5.5 (ref 5.0–8.0)

## 2022-07-14 LAB — POCT URINE PREGNANCY: Preg Test, Ur: NEGATIVE

## 2022-07-14 MED ORDER — FLUCONAZOLE 150 MG PO TABS: 150.0000 mg | ORAL_TABLET | ORAL | 0 refills | Status: DC

## 2022-07-14 MED ORDER — CIPROFLOXACIN HCL 500 MG PO TABS: 500.0000 mg | ORAL_TABLET | Freq: Two times a day (BID) | ORAL | 0 refills | Status: AC

## 2022-07-14 NOTE — ED Triage Notes (Signed)
Pt states that she is having some vaginal discharge. X1 week

## 2022-07-14 NOTE — ED Provider Notes (Signed)
[facility] - URGENT CARE CENTER  Note:  This document was prepared using Systems analyst and may include unintentional dictation errors.  MRN: [id number] DOB: 10-14-2000  Subjective:   Amanda Sanders is a 22 y.o. female presenting for 1 week history of vaginal discharge, urinary frequency/urgency. Denies fever, n/v, abdominal pain, pelvic pain, rashes, dysuria, hematuria, vaginal discharge.  Has been trying to hydrate well, has history of UTIs.  Drinks a lot of coffee and alternates with energy drinks.  She also has a history of yeast infections.  Would like to be covered for this.  No current facility-administered medications for this encounter.  Current Outpatient Medications:    VIENVA 0.1-20 MG-MCG tablet, Take 1 tablet by mouth daily., Disp: , Rfl:    acetaminophen (TYLENOL) 500 MG tablet, Take 500 mg by mouth every 6 (six) hours as needed for mild pain or headache., Disp: , Rfl:    Blood Pressure Monitoring (BLOOD PRESSURE MONITOR/L CUFF) MISC, Measure BP daily, or if you have  HA, RUQ pain or vision changes, if >150/100 report to [facility], Disp: 1 each, Rfl: 0   clobetasol cream (TEMOVATE) [id number] %, Apply 1 application topically 2 (two) times daily., Disp: 30 g, Rfl: 0   NIFEdipine (ADALAT CC) 30 MG 24 hr tablet, Take 1 tablet (30 mg total) by mouth daily., Disp: 30 tablet, Rfl: 1   prenatal vitamin w/FE, FA (PRENATAL 1 + 1) 27-1 MG TABS tablet, Take 1 tablet by mouth daily at 12 noon., Disp: , Rfl:    Allergies  Allergen Reactions   Penicillins Rash    ALL "CILLINS" PER PATIENT Did it involve swelling of the face/tongue/throat, SOB, or low BP? No Did it involve sudden or severe rash/hives, skin peeling, or any reaction on the inside of your mouth or nose? No Did you need to seek medical attention at a hospital or doctor's office? yes When did it last happen?  2018     If all above answers are "NO", may proceed with cephalosporin use.    Past Medical  History:  Diagnosis Date   UTI (urinary tract infection)      Past Surgical History:  Procedure Laterality Date   NO PAST SURGERIES      Family History  Problem Relation Age of Onset   Healthy Mother    Healthy Father     Social History   Tobacco Use   Smoking status: Never   Smokeless tobacco: Never  Vaping Use   Vaping Use: Never used  Substance Use Topics   Alcohol use: Yes    Comment: occ   Drug use: Never    ROS   Objective:   Vitals: BP 113/70 (BP Location: Right Arm)   Pulse 76   Temp 98.8 F (37.1 C) (Oral)   Resp 18   Ht 5' 1"$  (1.549 m)   Wt 127 lb (57.6 kg)   LMP 06/23/2022   SpO2 97%   BMI 24.00 kg/m   Physical Exam Constitutional:      General: She is not in acute distress.    Appearance: Normal appearance. She is well-developed. She is not ill-appearing, toxic-appearing or diaphoretic.  HENT:     Head: Normocephalic and atraumatic.     Nose: Nose normal.     Mouth/Throat:     Mouth: Mucous membranes are moist.  Eyes:     General: No scleral icterus.       Right eye: No discharge.  Left eye: No discharge.     Extraocular Movements: Extraocular movements intact.     Conjunctiva/sclera: Conjunctivae normal.  Cardiovascular:     Rate and Rhythm: Normal rate.  Pulmonary:     Effort: Pulmonary effort is normal.  Abdominal:     General: Bowel sounds are normal. There is no distension.     Palpations: Abdomen is soft. There is no mass.     Tenderness: There is no abdominal tenderness. There is no right CVA tenderness, left CVA tenderness, guarding or rebound.  Skin:    General: Skin is warm and dry.  Neurological:     General: No focal deficit present.     Mental Status: She is alert and oriented to person, place, and time.  Psychiatric:        Mood and Affect: Mood normal.        Behavior: Behavior normal.        Thought Content: Thought content normal.        Judgment: Judgment normal.    Results for orders placed or  performed during the hospital encounter of 07/14/22 (from the past 24 hour(s))  POCT urine pregnancy     Status: None   Collection Time: 07/14/22  2:08 PM  Result Value Ref Range   Preg Test, Ur Negative Negative  POCT urinalysis dipstick     Status: Abnormal   Collection Time: 07/14/22  2:08 PM  Result Value Ref Range   Color, UA yellow yellow   Clarity, UA clear clear   Glucose, UA negative negative mg/dL   Bilirubin, UA negative negative   Ketones, POC UA small (15) (A) negative mg/dL   Spec Grav, UA >=1.030 (A) 1.010 - 1.025   Blood, UA negative negative   pH, UA 5.5 5.0 - 8.0   Protein Ur, POC negative negative mg/dL   Urobilinogen, UA 0.2 0.2 or 1.0 E.U./dL   Nitrite, UA Positive (A) Negative   Leukocytes, UA Trace (A) Negative    Assessment and Plan :   PDMP not reviewed this encounter.  1. Acute cystitis without hematuria   2. Vaginal discharge   3. Acute vaginitis     Treat empirically for acute cystitis, yeast vaginitis with ciprofloxacin fluconazole.  Urine culture and vaginal swab results pending.  Recommended avoiding urinary irritants and hydrating very consistently. Counseled patient on potential for adverse effects with medications prescribed/recommended today, ER and return-to-clinic precautions discussed, patient verbalized understanding.    Jaynee Eagles, PA-C 07/14/22 1420

## 2022-07-14 NOTE — Discharge Instructions (Addendum)
Please start Keflex and fluconazole to address an urinary tract infection and yeast vaginitis. Make sure you hydrate very well with plain water and a quantity of 64 ounces of water a day.  Please limit drinks that are considered urinary irritants such as soda, sweet tea, coffee, energy drinks, alcohol.  These can worsen your urinary and genital symptoms but also be the source of them.  I will let you know about your urine culture results through MyChart to see if we need to prescribe or change your antibiotics based off of those results.

## 2022-07-16 LAB — URINE CULTURE: Culture: >=100000 — AB

## 2022-07-16 LAB — CERVICOVAGINAL ANCILLARY ONLY
Bacterial Vaginitis (gardnerella): NEGATIVE
Candida Glabrata: NEGATIVE
Candida Vaginitis: POSITIVE — AB
Chlamydia: NEGATIVE
Comment: NEGATIVE
Comment: NEGATIVE
Comment: NEGATIVE
Comment: NEGATIVE
Comment: NEGATIVE
Comment: NORMAL
Neisseria Gonorrhea: NEGATIVE
Trichomonas: NEGATIVE

## 2022-11-26 ENCOUNTER — Ambulatory Visit
Admission: EM | Admit: 2022-11-26 | Discharge: 2022-11-26 | Disposition: A | Discharge status: Home / Self Care | Payer: BC Managed Care – PPO | Attending: Urgent Care | Admitting: Urgent Care

## 2022-11-26 DIAGNOSIS — N760 Acute vaginitis: Secondary | ICD-10-CM

## 2022-11-26 MED ORDER — FLUCONAZOLE 150 MG PO TABS: 150.0000 mg | ORAL_TABLET | ORAL | 0 refills | Status: AC

## 2022-11-26 NOTE — ED Triage Notes (Signed)
Pt c/o vaginal d/c x 1 week-NAD-steady gait 

## 2022-11-26 NOTE — ED Provider Notes (Signed)
[facility]  Note:  This document was prepared using Conservation officer, historic buildings and may include unintentional dictation errors.  MRN: [id number] DOB: Jun 23, 2000  Subjective:   Amanda Sanders is a 22 y.o. female presenting for 1 week history of persistent vaginal itching, irritation and vaginal discharge.  Patient reports being prone to yeast infections.  No concern for pregnancy per patient.  Denies fever, n/v, abdominal pain, pelvic pain, rashes, dysuria, urinary frequency, hematuria.  Is not opposed to STI testing.  No current facility-administered medications for this encounter.  Current Outpatient Medications:    acetaminophen (TYLENOL) 500 MG tablet, Take 500 mg by mouth every 6 (six) hours as needed for mild pain or headache., Disp: , Rfl:    Blood Pressure Monitoring (BLOOD PRESSURE MONITOR/L CUFF) MISC, Measure BP daily, or if you have  HA, RUQ pain or vision changes, if >150/100 report to [facility], Disp: 1 each, Rfl: 0   ciprofloxacin (CIPRO) 500 MG tablet, Take 1 tablet (500 mg total) by mouth every 12 (twelve) hours., Disp: 10 tablet, Rfl: 0   clobetasol cream (TEMOVATE) 0.05 %, Apply 1 application topically 2 (two) times daily., Disp: 30 g, Rfl: 0   fluconazole (DIFLUCAN) 150 MG tablet, Take 1 tablet (150 mg total) by mouth once a week., Disp: 2 tablet, Rfl: 0   NIFEdipine (ADALAT CC) 30 MG 24 hr tablet, Take 1 tablet (30 mg total) by mouth daily., Disp: 30 tablet, Rfl: 1   prenatal vitamin w/FE, FA (PRENATAL 1 + 1) 27-1 MG TABS tablet, Take 1 tablet by mouth daily at 12 noon., Disp: , Rfl:    VIENVA 0.1-20 MG-MCG tablet, Take 1 tablet by mouth daily., Disp: , Rfl:    Allergies  Allergen Reactions   Penicillins Rash    ALL "CILLINS" PER PATIENT Did it involve swelling of the face/tongue/throat, SOB, or low BP? No Did it involve sudden or severe rash/hives, skin peeling, or any reaction on the inside of your mouth or nose? No Did you need to seek  medical attention at a hospital or doctor's office? yes When did it last happen?  2018     If all above answers are "NO", may proceed with cephalosporin use.    Past Medical History:  Diagnosis Date   UTI (urinary tract infection)      Past Surgical History:  Procedure Laterality Date   NO PAST SURGERIES      Family History  Problem Relation Age of Onset   Healthy Mother    Healthy Father     Social History   Tobacco Use   Smoking status: Never   Smokeless tobacco: Never  Vaping Use   Vaping Use: Never used  Substance Use Topics   Alcohol use: Yes    Comment: occ   Drug use: Never    ROS   Objective:   Vitals: BP 105/73 (BP Location: Left Arm)   Pulse 71   Temp 97.8 F (36.6 C) (Oral)   Resp 18   LMP 11/12/2022 (Approximate)   SpO2 98%   Physical Exam Constitutional:      General: She is not in acute distress.    Appearance: Normal appearance. She is well-developed. She is not ill-appearing, toxic-appearing or diaphoretic.  HENT:     Head: Normocephalic and atraumatic.     Nose: Nose normal.     Mouth/Throat:     Mouth: Mucous membranes are moist.  Eyes:     General: No scleral icterus.  Right eye: No discharge.        Left eye: No discharge.     Extraocular Movements: Extraocular movements intact.  Cardiovascular:     Rate and Rhythm: Normal rate.  Pulmonary:     Effort: Pulmonary effort is normal.  Skin:    General: Skin is warm and dry.  Neurological:     General: No focal deficit present.     Mental Status: She is alert and oriented to person, place, and time.  Psychiatric:        Mood and Affect: Mood normal.        Behavior: Behavior normal.     Assessment and Plan :   PDMP not reviewed this encounter.  1. Acute vaginitis    Will treat empirically with fluconazole for yeast vaginitis.  Labs pending, will treat as appropriate otherwise.  Counseled patient on potential for adverse effects with medications  prescribed/recommended today, ER and return-to-clinic precautions discussed, patient verbalized understanding.    Wallis Bamberg, PA-C 11/26/22 1032

## 2022-11-26 NOTE — Discharge Instructions (Addendum)
Go ahead and start fluconazole to address a suspected recurring yeast infection.  We will update you with your test results tomorrow and make changes if necessary to your treatment.  Thank you for letting us take care of you!

## 2022-11-27 LAB — CERVICOVAGINAL ANCILLARY ONLY
Bacterial Vaginitis (gardnerella): NEGATIVE
Candida Glabrata: NEGATIVE
Candida Vaginitis: POSITIVE — AB
Chlamydia: NEGATIVE
Comment: NEGATIVE
Comment: NEGATIVE
Comment: NEGATIVE
Comment: NEGATIVE
Comment: NEGATIVE
Comment: NORMAL
Neisseria Gonorrhea: NEGATIVE
Trichomonas: NEGATIVE
